# Patient Record
Sex: Female | Born: 1985 | Race: Black or African American | Hispanic: No | Marital: Single | Smoking: Never smoker
Health system: Southern US, Community
[De-identification: ages and names within clinical notes are randomized; demographics above are authoritative.]

## PROBLEM LIST (undated history)

## (undated) HISTORY — PX: NO PAST SURGERIES: SHX2092

---

## 2006-12-21 ENCOUNTER — Emergency Department: Payer: Self-pay | Admitting: Internal Medicine

## 2007-10-09 ENCOUNTER — Emergency Department (HOSPITAL_COMMUNITY): Admission: EM | Admit: 2007-10-09 | Discharge: 2007-10-10 | Payer: Self-pay | Admitting: Emergency Medicine

## 2007-10-10 ENCOUNTER — Emergency Department (HOSPITAL_COMMUNITY): Admission: EM | Admit: 2007-10-10 | Discharge: 2007-10-11 | Payer: Self-pay | Admitting: Emergency Medicine

## 2008-09-12 ENCOUNTER — Emergency Department: Payer: Self-pay

## 2008-12-11 ENCOUNTER — Emergency Department: Payer: Self-pay | Admitting: Emergency Medicine

## 2009-07-03 DIAGNOSIS — A6000 Herpesviral infection of urogenital system, unspecified: Secondary | ICD-10-CM | POA: Insufficient documentation

## 2009-10-25 ENCOUNTER — Emergency Department: Payer: Self-pay | Admitting: Emergency Medicine

## 2010-06-21 ENCOUNTER — Emergency Department: Payer: Self-pay | Admitting: Emergency Medicine

## 2010-09-27 ENCOUNTER — Observation Stay: Payer: Self-pay | Admitting: Obstetrics & Gynecology

## 2010-09-30 ENCOUNTER — Observation Stay: Payer: Self-pay | Admitting: Obstetrics and Gynecology

## 2010-11-12 ENCOUNTER — Observation Stay: Payer: Self-pay

## 2010-11-26 ENCOUNTER — Observation Stay: Payer: Self-pay

## 2010-11-29 ENCOUNTER — Observation Stay: Payer: Self-pay

## 2010-12-01 ENCOUNTER — Observation Stay: Payer: Self-pay | Admitting: Obstetrics and Gynecology

## 2010-12-02 ENCOUNTER — Inpatient Hospital Stay: Payer: Self-pay

## 2012-02-20 ENCOUNTER — Ambulatory Visit: Payer: Self-pay | Admitting: Family Medicine

## 2012-09-26 ENCOUNTER — Emergency Department: Payer: Self-pay | Admitting: Emergency Medicine

## 2012-09-26 LAB — URINALYSIS, COMPLETE
Bilirubin,UR: NEGATIVE
Ketone: NEGATIVE
Protein: 30
RBC,UR: 5 /HPF (ref 0–5)
Squamous Epithelial: 43
WBC UR: 14 /HPF (ref 0–5)

## 2012-10-20 ENCOUNTER — Observation Stay: Payer: Self-pay | Admitting: Obstetrics & Gynecology

## 2012-10-20 LAB — URINALYSIS, COMPLETE
Bilirubin,UR: NEGATIVE
Ketone: NEGATIVE
Leukocyte Esterase: NEGATIVE
Protein: NEGATIVE
Squamous Epithelial: 1

## 2012-12-28 ENCOUNTER — Observation Stay: Payer: Self-pay

## 2012-12-28 LAB — URINALYSIS, COMPLETE
Bilirubin,UR: NEGATIVE
Blood: NEGATIVE
Glucose,UR: NEGATIVE mg/dL (ref 0–75)
Ketone: NEGATIVE
Leukocyte Esterase: NEGATIVE
Squamous Epithelial: 2
WBC UR: 1 /HPF (ref 0–5)

## 2012-12-29 ENCOUNTER — Ambulatory Visit: Payer: Self-pay | Admitting: Obstetrics & Gynecology

## 2012-12-31 ENCOUNTER — Observation Stay: Payer: Self-pay | Admitting: Obstetrics and Gynecology

## 2012-12-31 LAB — URINALYSIS, COMPLETE
Bilirubin,UR: NEGATIVE
Blood: NEGATIVE
Glucose,UR: NEGATIVE mg/dL (ref 0–75)
Ph: 7 (ref 4.5–8.0)
Protein: NEGATIVE
RBC,UR: NONE SEEN /HPF (ref 0–5)
Specific Gravity: 1.01 (ref 1.003–1.030)

## 2013-01-25 ENCOUNTER — Observation Stay: Payer: Self-pay | Admitting: Obstetrics and Gynecology

## 2013-02-13 ENCOUNTER — Observation Stay: Payer: Self-pay

## 2013-02-15 ENCOUNTER — Observation Stay: Payer: Self-pay

## 2013-02-20 ENCOUNTER — Observation Stay: Payer: Self-pay | Admitting: Obstetrics & Gynecology

## 2013-02-21 ENCOUNTER — Observation Stay: Payer: Self-pay | Admitting: Obstetrics & Gynecology

## 2013-02-22 ENCOUNTER — Inpatient Hospital Stay: Payer: Self-pay

## 2013-02-22 LAB — CBC WITH DIFFERENTIAL/PLATELET
Basophil #: 0 10*3/uL (ref 0.0–0.1)
Basophil %: 0.3 %
Eosinophil %: 0.7 %
Lymphocyte %: 10.7 %
MCHC: 34 g/dL (ref 32.0–36.0)
MCV: 91 fL (ref 80–100)
Neutrophil #: 9.9 10*3/uL — ABNORMAL HIGH (ref 1.4–6.5)
Neutrophil %: 81.9 %
Platelet: 128 10*3/uL — ABNORMAL LOW (ref 150–440)
WBC: 12.1 10*3/uL — ABNORMAL HIGH (ref 3.6–11.0)

## 2013-02-23 LAB — HEMATOCRIT: HCT: 33 % — ABNORMAL LOW (ref 35.0–47.0)

## 2013-04-11 ENCOUNTER — Emergency Department: Payer: Self-pay | Admitting: Emergency Medicine

## 2013-12-02 ENCOUNTER — Emergency Department: Payer: Self-pay | Admitting: Emergency Medicine

## 2013-12-06 LAB — BETA STREP CULTURE(ARMC)

## 2014-09-16 ENCOUNTER — Emergency Department
Admit: 2014-09-16 | Disposition: A | Discharge status: Home / Self Care | Payer: Self-pay | Admitting: Emergency Medicine

## 2014-09-16 LAB — BASIC METABOLIC PANEL
ANION GAP: 5 — AB (ref 7–16)
BUN: 20 mg/dL
CALCIUM: 8.6 mg/dL — AB
CO2: 27 mmol/L
Chloride: 107 mmol/L
Creatinine: 0.75 mg/dL
GLUCOSE: 96 mg/dL
Potassium: 3.4 mmol/L — ABNORMAL LOW
SODIUM: 139 mmol/L

## 2014-09-16 LAB — CBC
HCT: 35.4 % (ref 35.0–47.0)
HGB: 11.8 g/dL — ABNORMAL LOW (ref 12.0–16.0)
MCH: 29.6 pg (ref 26.0–34.0)
MCHC: 33.3 g/dL (ref 32.0–36.0)
MCV: 89 fL (ref 80–100)
Platelet: 139 10*3/uL — ABNORMAL LOW (ref 150–440)
RBC: 3.98 10*6/uL (ref 3.80–5.20)
RDW: 12.2 % (ref 11.5–14.5)
WBC: 7.4 10*3/uL (ref 3.6–11.0)

## 2014-09-16 LAB — CK TOTAL AND CKMB (NOT AT ARMC)
CK, Total: 76 U/L
CK-MB: 0.7 ng/mL

## 2014-09-16 LAB — TROPONIN I: Troponin-I: <0.03 ng/mL

## 2014-10-18 NOTE — H&P (Signed)
L&D Evaluation:  History Expanded:  HPI 29 yo G2 P1, EDD of 02/21/13 per LMP and 12 week US, presents at 295w1d with c/o contractions since getting off work about 2 hours ago. Denies LOF or VB, +FM. [facility], +HSV culture.   Blood Type (Maternal) O positive   Group B Strep Results Maternal (Result >5wks must be treated as unknown) unknown/result > 5 weeks ago   Maternal HIV Negative   Maternal Syphilis Ab Nonreactive   Maternal Varicella Immune   Rubella Results (Maternal) immune   Maternal T-Dap Unknown   Patient's Medical History No Chronic Illness   Patient's Surgical History toe surgery   Medications Pre Natal Vitamins   Allergies PCN, hives   Social History none   ROS:  ROS see HPI   Exam:  Vital Signs stable   General no apparent distress   Mental Status clear   Chest clear   Heart no murmur/gallop/rubs   Abdomen gravid, tender with contractions   Estimated Fetal Weight Average for gestational age   Fetal Position vertex   Pelvic 1/75/-2   Mebranes Intact   FHT normal rate with no decels, baseline 145, mod variability, + accels   Ucx regular, q 1-4 min   Impression:  Impression IUP at 435w1d with PTL   Plan:  Plan UA   Comments Discussed with Dr Bonney AidStaebler, will give Terbutaline SQ and begin BMZ for fetal lung maturity. Recheck in about 2 hours, if making cervical change will begin Antibiotics for GBS prophylaxis. Consider Neonatalogy consult if delivery appears imminent.   Electronic Signatures: Vella KohlerBrothers, Dov Dill K (CNM)  (Signed 21-Jul-14 21:17)  Authored: L&D Evaluation   Last Updated: 21-Jul-14 21:17 by Vella KohlerBrothers, Younis Mathey K (CNM)

## 2014-10-18 NOTE — H&P (Signed)
L&D Evaluation:  History Expanded:  HPI 29 yo G2P1001 who was here on Monday night and got two shots of betamethasone returns tonight for contractions again. FFn is neg and ctx are palpable mild, her cervix id FT cm and she is comfortable. she did tis with her son also.   Gravida 2   Term 1   PreTerm 0   Abortion 0   Living 1   Blood Type (Maternal) O positive   Group B Strep Results Maternal (Result >5wks must be treated as unknown) unknown/result > 5 weeks ago   Maternal HIV Negative   Maternal Syphilis Ab Nonreactive   Maternal Varicella Immune   Rubella Results (Maternal) immune   M[facility] Immun[facility]   Presents with contractions   Patient's Medical History HSV 2 pos,   Patient's Surgical History none   Medications Pre Natal Vitamins   Allergies NKDA   Social History tobacco  before had pos UPT   Family History Non-Contributory   ROS:  ROS All systems were reviewed.  HEENT, CNS, GI, GU, Respiratory, CV, Renal and Musculoskeletal systems were found to be normal.   Exam:  Vital Signs stable   Urine Protein not completed   General no apparent distress   Mental Status clear   Chest clear   Heart normal sinus rhythm   Abdomen gravid, non-tender   Back no CVAT   Pelvic no external lesions   Mebranes Intact   FHT normal rate with no decels, CAT 1 strictly reacrive   Fetal Heart Rate 145   Ucx irregular   Ucx Frequency 34 min   Skin dry   Lymph no lymphadenopathy   Impression:  Impression preterm ctx   Plan:  Plan UA, EFM/NST, monitor contractions and for cervical change   Comments ffn and ua and mon itor v\cvxone cm. no change will gove terb and start on procardia.   Electronic Signatures: Adria DevonKlett, Harjit Leider (MD)  (Signed 25-Jul-14 00:59)  Authored: L&D Evaluation   Last Updated: 25-Jul-14 00:59 by Adria DevonKlett, Lamond Glantz (MD)

## 2014-10-18 NOTE — H&P (Signed)
L&D Evaluation:  History:  HPI 29 yo G2P1001, EDD of 02/21/13 per LMP and 12 week US, presents at 40.1 weeks with c/o contractions since yesterday. Seen in L&D last night and was discharged when cx remained 2 cm. [facility], received BMZ for threatened PTL at 32 weeks, HSV culture on labia this pregnancy -pt is taking Valtrex currently. Pt is O+, RI, VI, GBS negative.   Presents with contractions   Patient's Medical History HSV 2 pos   Patient's Surgical History removal of extra digit for polydactyly   Medications Pre Natal Vitamins  Valtrex dily. Was on FE-ran out recently   Allergies NKDA   Social History tobacco  quit with UPT   Family History Non-Contributory   ROS:  ROS see HPI   Exam:  Vital Signs stable   Urine Protein not completed   General breathing with contractions   Mental Status clear   Heart normal sinus rhythm, no murmur/gallop/rubs   Abdomen gravid, tender with contractions   Estimated Fetal Weight Average for gestational age, 86 1/2 to 7 #   Fetal Position cephalic   Reflexes 1+   Pelvic no external lesions, 4cm/75%/-1 (was 3cm on arrival)   Mebranes Intact   FHT normal rate with no decels, 125 baseline with accels to 150, mod variability   Ucx q4 min   Skin dry   Impression:  Impression IUP at 40 1/7 weeks in early/active labor   Plan:  Plan EFM/NST, monitor contractions and for cervical change, Stadol for pain. Epidural when desires. Will AROM when comfortable.   Electronic Signatures: Trinna BalloonGutierrez, Nayzeth Altman L (CNM)  (Signed 15-Sep-14 12:30)  Authored: L&D Evaluation   Last Updated: 15-Sep-14 12:30 by Trinna BalloonGutierrez, Acasia Skilton L (CNM)

## 2014-10-18 NOTE — H&P (Signed)
L&D Evaluation:  History Expanded:  HPI Patient at 22 weeks with pain in lower abd and back for few days. No vaginal bleeding.  +FM. No dysuria. Prenatal Care uncomplicated to this point.   Gravida 2   Term 1   Patient's Medical History No Chronic Illness   Patient's Surgical History none   Medications Pre Natal Vitamins   Allergies NKDA   Social History none   Family History Non-Contributory   ROS:  ROS All systems were reviewed.  HEENT, CNS, GI, GU, Respiratory, CV, Renal and Musculoskeletal systems were found to be normal.   Exam:  Vital Signs stable   General no apparent distress   Mental Status clear   Abdomen gravid, non-tender   Estimated Fetal Weight Average for gestational age   Back no CVAT   Edema no edema   Pelvic no external lesions, cervix closed and thick   FHT 130s   Ucx absent   Skin dry   Impression:  Impression Lower abd pain, no signs of Preterm Labor.   Plan:  Plan UA   Comments Monitor. Outpt follow-up. Tx UTI if pos UA. Hydrate.   Electronic Signatures: Letitia LibraHarris, Chloe Bluett Paul (MD)  (Signed 13-May-14 11:52)  Authored: L&D Evaluation   Last Updated: 13-May-14 11:52 by Letitia LibraHarris, Jeffery Bachmeier Paul (MD)

## 2014-10-18 NOTE — H&P (Signed)
L&D Evaluation:  History Expanded:  HPI 29 yo G2P1001, EDD of 02/21/13 per LMP and 12 week US, presents at 39 weeks with c/o contractions this evening. [facility], received BMZ for threatened PTL at 32 weeks, HSV culture on labia this pregnancy -pt is taking Valtrex currently.   Gravida 2   Term 1   PreTerm 0   Abortion 0   Living 1   Blood Type (Maternal) O positive   Group B Strep Results Maternal (Result >5wks must be treated as unknown) negative   Maternal HIV Negative   Maternal Syphilis Ab Nonreactive   Maternal Varicella Immune   Rubella Results (Maternal) immune   Maternal T-Dap Immune   [facility]   Presents with contractions   Patient's Medical History HSV 2 pos   Patient's Surgical History removal of extra digit for polydactyly   Medications Pre Natal Vitamins  Iron   Allergies NKDA   Social History tobacco  quit with UPT   Family History Non-Contributory   ROS:  ROS All systems were reviewed.  HEENT, CNS, GI, GU, Respiratory, CV, Renal and Musculoskeletal systems were found to be normal.   Exam:  Vital Signs stable   Urine Protein not completed   General no apparent distress   Mental Status clear   Chest clear   Heart normal sinus rhythm   Abdomen gravid, tender with contractions   Estimated Fetal Weight Average for gestational age   Fetal Position vertex   Back no CVAT   Pelvic no external lesions, 2/75/-2, unchanged in 1.5 hours   Mebranes Intact   FHT normal rate with no decels, Category 1 tracing   Ucx regular, q 2-6 minutes   Skin dry   Lymph no lymphadenopathy   Impression:  Impression early labor   Plan:  Comments Tylenolol and snack for headache Ambulate in hallways and recheck cervix in 2 hours   Electronic Signatures: Vella KohlerBrothers, Angles Trevizo K (CNM)  (Signed 07-Sep-14 00:33)  Authored: L&D Evaluation   Last Updated: 07-Sep-14 00:33 by Vella KohlerBrothers, Azzie Thiem K (CNM)

## 2014-10-18 NOTE — H&P (Signed)
L&D Evaluation:  History:  HPI 29 yo G2P1001, EDD of 02/21/13 per LMP and 12 week US, presents at 39.1 weeks with c/o contractions this evening. [facility], received BMZ for threatened PTL at 32 weeks, HSV culture on labia this pregnancy -pt is taking Valtrex currently. Pt is O+, RI, VI, GBS negative. Some vaginal spotting noticed today after being checked in the clinic and a small spot on her pad today in the hospital after a vaginal exam.   Presents with contractions   Patient's Medical History HSV 2 pos   Patient's Surgical History removal of extra digit for polydactyly   Medications Pre Natal Vitamins  Iron   Allergies NKDA   Social History tobacco  quit with UPT   Family History Non-Contributory   ROS:  ROS All systems were reviewed.  HEENT, CNS, GI, GU, Respiratory, CV, Renal and Musculoskeletal systems were found to be normal.   Exam:  Vital Signs stable   Urine Protein not completed   General no apparent distress   Mental Status clear   Chest clear   Heart normal sinus rhythm   Abdomen gravid, tender with contractions   Estimated Fetal Weight Average for gestational age   Fetal Position vertex   Back no CVAT   Pelvic no external lesions, 2-3/60/-3, unchanged in 1.5 hours   Mebranes Intact   FHT normal rate with no decels, Category 1 tracing   Ucx regular, q 2-6 minutes   Skin dry   Lymph no lymphadenopathy   Impression:  Impression early labor, reactive NST   Plan:  Plan discharge, will give 10mg  morphine IM for therapeutic rest. Labor precautions discussed.   Follow Up Appointment already scheduled   Electronic Signatures: Jannet MantisSubudhi, Delynn Olvera (CNM)  (Signed 08-Sep-14 22:44)  Authored: L&D Evaluation   Last Updated: 08-Sep-14 22:44 by Jannet MantisSubudhi, Huck Ashworth (CNM)

## 2015-07-04 ENCOUNTER — Encounter: Payer: Self-pay | Admitting: *Deleted

## 2015-07-04 ENCOUNTER — Emergency Department
Admission: EM | Admit: 2015-07-04 | Discharge: 2015-07-04 | Disposition: A | Discharge status: Home / Self Care | Payer: Medicaid Other | Attending: Emergency Medicine | Admitting: Emergency Medicine

## 2015-07-04 DIAGNOSIS — M25511 Pain in right shoulder: Secondary | ICD-10-CM | POA: Diagnosis not present

## 2015-07-04 DIAGNOSIS — F17200 Nicotine dependence, unspecified, uncomplicated: Secondary | ICD-10-CM | POA: Insufficient documentation

## 2015-07-04 DIAGNOSIS — M438X2 Other specified deforming dorsopathies, cervical region: Secondary | ICD-10-CM | POA: Diagnosis not present

## 2015-07-04 DIAGNOSIS — M436 Torticollis: Secondary | ICD-10-CM | POA: Insufficient documentation

## 2015-07-04 DIAGNOSIS — M438X4 Other specified deforming dorsopathies, thoracic region: Secondary | ICD-10-CM | POA: Insufficient documentation

## 2015-07-04 DIAGNOSIS — Z880 Allergy status to penicillin: Secondary | ICD-10-CM | POA: Diagnosis not present

## 2015-07-04 DIAGNOSIS — M542 Cervicalgia: Secondary | ICD-10-CM | POA: Diagnosis present

## 2015-07-04 MED ORDER — METHOCARBAMOL 500 MG PO TABS
1000.0000 mg | ORAL_TABLET | Freq: Once | ORAL | Status: AC
  Administered 2015-07-04: 1000 mg via ORAL
  Filled 2015-07-04: qty 2

## 2015-07-04 MED ORDER — IBUPROFEN 800 MG PO TABS
800.0000 mg | ORAL_TABLET | Freq: Once | ORAL | Status: AC
  Administered 2015-07-04: 800 mg via ORAL
  Filled 2015-07-04: qty 1

## 2015-07-04 MED ORDER — CYCLOBENZAPRINE HCL 10 MG PO TABS: 10.0000 mg | ORAL_TABLET | Freq: Three times a day (TID) | ORAL | Status: DC | PRN

## 2015-07-04 MED ORDER — TRAMADOL HCL 50 MG PO TABS: 50.0000 mg | ORAL_TABLET | Freq: Four times a day (QID) | ORAL | Status: DC | PRN

## 2015-07-04 MED ORDER — IBUPROFEN 800 MG PO TABS: 800.0000 mg | ORAL_TABLET | Freq: Three times a day (TID) | ORAL | Status: DC | PRN

## 2015-07-04 MED ORDER — TRAMADOL HCL 50 MG PO TABS
50.0000 mg | ORAL_TABLET | Freq: Once | ORAL | Status: AC
  Administered 2015-07-04: 50 mg via ORAL
  Filled 2015-07-04: qty 1

## 2015-07-04 NOTE — ED Notes (Signed)
States she developed right shoulder and neck pain today  Unsure of injury  But states she did not have pain when she woke up this am

## 2015-07-04 NOTE — Discharge Instructions (Signed)
Acute Torticollis °Torticollis is a condition in which the muscles of the neck tighten (contract) abnormally, causing the neck to twist and the head to move into an unnatural position. Torticollis that develops suddenly is called acute torticollis. If torticollis becomes chronic and is left untreated, the face and neck can become deformed. °CAUSES °This condition may be caused by: °· Sleeping in an awkward position (common). °· Extending or twisting the neck muscles beyond their normal position. °· Infection. °In some cases, the cause may not be known. °SYMPTOMS °Symptoms of this condition include: °· An unnatural position of the head. °· Neck pain. °· A limited ability to move the neck. °· Twisting of the neck to one side. °DIAGNOSIS °This condition is diagnosed with a physical exam. You may also have imaging tests, such as an X-ray, CT scan, or MRI. °TREATMENT °Treatment for this condition involves trying to relax the neck muscles. It may include: °· Medicines or shots. °· Physical therapy. °· Surgery. This may be done in severe cases. °HOME CARE INSTRUCTIONS °· Take medicines only as directed by your health care provider. °· Do stretching exercises and massage your neck as directed by your health care provider. °· Keep all follow-up visits as directed by your health care provider. This is important. °SEEK MEDICAL CARE IF: °· You develop a fever. °SEEK IMMEDIATE MEDICAL CARE IF: °· You develop difficulty breathing. °· You develop noisy breathing (stridor). °· You start drooling. °· You have trouble swallowing or have pain with swallowing. °· You develop numbness or weakness in your hands or feet. °· You have changes in your speech, understanding, or vision. °· Your pain gets worse. °  °This information is not intended to replace advice given to you by your health care provider. Make sure you discuss any questions you have with your health care provider. °  °Document Released: 05/24/2000 Document Revised:  10/11/2014 Document Reviewed: 05/23/2014 °Elsevier Interactive Patient Education ©2016 [employer] ° °

## 2015-07-04 NOTE — ED Notes (Signed)
Pt reports right side neck pain and right shoulder pain after sleeping on it last night.  Pt otc meds today for pain without relief.   Denies injury to neck/shoulder.

## 2015-07-04 NOTE — ED Provider Notes (Signed)
[facility] Emergency Department Provider Note  ____________________________________________  Time seen: Approximately 4:03 PM  I have reviewed the triage vital signs and the nursing notes.   HISTORY  Chief Complaint Torticollis    HPI Jaquanda Wickersham is a 30 y.o. female patient complaining of right neck in right upper shoulder pain that occurred after she woke up this morning. Patient states no relief with over-the-counter medications. Patient denies any other provocative incident for her complaint. Patient denies any loss of sensation or strength to the right upper extremity. Patient states neck pain increases with right lateral movements and abduction of the right upper extremity. Patient is rating her pain as a 9/10. Patient describes the pain as "sharp". No other palliative measures for her complaint.   No past medical history on file.  There are no active problems to display for this patient.   No past surgical history on file.  Current Outpatient Rx  Name  Route  Sig  Dispense  Refill  . cyclobenzaprine (FLEXERIL) 10 MG tablet   Oral   Take 1 tablet (10 mg total) by mouth every 8 (eight) hours as needed for muscle spasms.   15 tablet   0   . ibuprofen (ADVIL,MOTRIN) 800 MG tablet   Oral   Take 1 tablet (800 mg total) by mouth every 8 (eight) hours as needed for moderate pain.   15 tablet   0   . traMADol (ULTRAM) 50 MG tablet   Oral   Take 1 tablet (50 mg total) by mouth every 6 (six) hours as needed for moderate pain.   12 tablet   0     Allergies Penicillins  No family history on file.  Social History Social History  Substance Use Topics  . Smoking status: Current Every Day Smoker  . Smokeless tobacco: None  . Alcohol Use: No    Review of Systems Constitutional: No fever/chills Eyes: No visual changes. ENT: No sore throat. Cardiovascular: Denies chest pain. Respiratory: Denies shortness of breath. Gastrointestinal: No  abdominal pain.  No nausea, no vomiting.  No diarrhea.  No constipation. Genitourinary: Negative for dysuria. Musculoskeletal: Lateral neck and right shoulder pain. Skin: Negative for rash. Neurological: Negative for headaches, focal weakness or numbness. 10-point ROS otherwise negative.  ____________________________________________   PHYSICAL EXAM:  VITAL SIGNS: ED Triage Vitals  Enc Vitals Group     BP 07/04/15 1542 100/45 mmHg     Pulse Rate 07/04/15 1542 75     Resp 07/04/15 1542 20     Temp 07/04/15 1542 98.3 F (36.8 C)     Temp Source 07/04/15 1542 Oral     SpO2 07/04/15 1542 99 %     Weight 07/04/15 1542 120 lb (54.432 kg)     Height 07/04/15 1542  (1.626 m)     Head Cir --      Peak Flow --      Pain Score 07/04/15 1557 9     Pain Loc --      Pain Edu? --      Excl. in GC? --     Constitutional: Alert and oriented. Well appearing and in no acute distress. Eyes: Conjunctivae are normal. PERRL. EOMI. Head: Atraumatic. Nose: No congestion/rhinnorhea. Mouth/Throat: Mucous membranes are moist.  Oropharynx non-erythematous. Neck: No stridor.  No cervical spine tenderness to palpation. Hematological/Lymphatic/Immunilogical: No cervical lymphadenopathy. Cardiovascular: Normal rate, regular rhythm. Grossly normal heart sounds.  Good peripheral circulation. Respiratory: Normal respiratory effort.  No retractions. Lungs  CTAB. Gastrointestinal: Soft and nontender. No distention. No abdominal bruits. No CVA tenderness. Musculoskeletal: Deformity to the cervical and thoracic spine. Patient decreased range of motion right lateral movements. Patient also has increased pain with abduction of the right upper extremity. Neurologic:  Normal speech and language. No gross focal neurologic deficits are appreciated. No gait instability. Skin:  Skin is warm, dry and intact. No rash noted. Psychiatric: Mood and affect are normal. Speech and behavior are  normal.  ____________________________________________   LABS (all labs ordered are listed, but only abnormal results are displayed)  Labs Reviewed - No data to display ____________________________________________  EKG   ____________________________________________  RADIOLOGY   ____________________________________________   PROCEDURES  Procedure(s) performed: None  Critical Care performed: No  ____________________________________________   INITIAL IMPRESSION / ASSESSMENT AND PLAN / ED COURSE  Pertinent labs & imaging results that were available during my care of the patient were reviewed by me and considered in my medical decision making (see chart for details).  Torticollis. Patient given discharge care instructions. Patient given a prescription for tramadol, Flexeril, ibuprofen. Patient given a work note and advised [facility]. ____________________________________________   FINAL CLINICAL IMPRESSION(S) / ED DIAGNOSES  Final diagnoses:  Torticollis      Joni Reining, PA-C 07/04/15 1617  Phineas Semen, MD 07/04/15 [phone]

## 2015-07-12 ENCOUNTER — Encounter: Payer: Self-pay | Admitting: Emergency Medicine

## 2015-07-12 ENCOUNTER — Emergency Department
Admission: EM | Admit: 2015-07-12 | Discharge: 2015-07-12 | Disposition: A | Discharge status: Home / Self Care | Payer: Medicaid Other | Attending: Emergency Medicine | Admitting: Emergency Medicine

## 2015-07-12 DIAGNOSIS — J029 Acute pharyngitis, unspecified: Secondary | ICD-10-CM | POA: Diagnosis present

## 2015-07-12 DIAGNOSIS — F17200 Nicotine dependence, unspecified, uncomplicated: Secondary | ICD-10-CM | POA: Insufficient documentation

## 2015-07-12 DIAGNOSIS — J069 Acute upper respiratory infection, unspecified: Secondary | ICD-10-CM | POA: Insufficient documentation

## 2015-07-12 DIAGNOSIS — Z880 Allergy status to penicillin: Secondary | ICD-10-CM | POA: Insufficient documentation

## 2015-07-12 LAB — POCT RAPID STREP A: STREPTOCOCCUS, GROUP A SCREEN (DIRECT): NEGATIVE

## 2015-07-12 MED ORDER — PSEUDOEPH-BROMPHEN-DM 30-2-10 MG/5ML PO SYRP: 5.0000 mL | ORAL_SOLUTION | Freq: Four times a day (QID) | ORAL | Status: DC | PRN

## 2015-07-12 NOTE — ED Provider Notes (Signed)
[facility] Emergency Department Provider Note  ____________________________________________  Time seen: Approximately 12:44 PM  I have reviewed the triage vital signs and the nursing notes.   HISTORY  Chief Complaint Nasal Congestion; Generalized Body Aches; and Sore Throat    HPI Quintella Mura is a 30 y.o. female patient complaining of nasal congestion, sore throat, generalized body aches for 3 days. He stated is a greenishnasal discharge and a productive cough. Denies any fever but states she has chills. Patient denies any vomiting diarrhea but had intermittent nausea. No palliative measures taken for this complaint. Patient also has a son here who has a fever.   History reviewed. No pertinent past medical history.  There are no active problems to display for this patient.   History reviewed. No pertinent past surgical history.  Current Outpatient Rx  Name  Route  Sig  Dispense  Refill  . cyclobenzaprine (FLEXERIL) 10 MG tablet   Oral   Take 1 tablet (10 mg total) by mouth every 8 (eight) hours as needed for muscle spasms.   15 tablet   0   . ibuprofen (ADVIL,MOTRIN) 800 MG tablet   Oral   Take 1 tablet (800 mg total) by mouth every 8 (eight) hours as needed for moderate pain.   15 tablet   0   . traMADol (ULTRAM) 50 MG tablet   Oral   Take 1 tablet (50 mg total) by mouth every 6 (six) hours as needed for moderate pain.   12 tablet   0   . brompheniramine-pseudoephedrine-DM 30-2-10 MG/5ML syrup   Oral   Take 5 mLs by mouth 4 (four) times daily as needed.   120 mL   0     Allergies Penicillins  History reviewed. No pertinent family history.  Social History Social History  Substance Use Topics  . Smoking status: Current Some Day Smoker  . Smokeless tobacco: None  . Alcohol Use: No    Review of Systems Constitutional: No fever/chills Eyes: No visual changes. ENT: Sore throat. Rhinorrhea Cardiovascular: Denies chest  pain. Respiratory: Denies shortness of breath. Productive cough Gastrointestinal: No abdominal pain.  No nausea, no vomiting.  No diarrhea.  No constipation. Genitourinary: Negative for dysuria. Musculoskeletal: Negative for back pain. Skin: Negative for rash. Neurological: Negative for headaches, focal weakness or numbness. Allergic/Immunilogical: Penicillin  10-point ROS otherwise negative.  ____________________________________________   PHYSICAL EXAM:  VITAL SIGNS: ED Triage Vitals  Enc Vitals Group     BP --      Pulse --      Resp --      Temp --      Temp src --      SpO2 --      Weight --      Height --      Head Cir --      Peak Flow --      Pain Score --      Pain Loc --      Pain Edu? --      Excl. in GC? --     Constitutional: Alert and oriented. Well appearing and in no acute distress. Eyes: Conjunctivae are normal. PERRL. EOMI. Head: Atraumatic. Nose: Edematous nasal turbinates. Mouth/Throat: Mucous membranes are moist.  Oropharynx erythematous without exudative tonsils. Neck: No stridor. No cervical spine tenderness to palpation. Hematological/Lymphatic/Immunilogical: No cervical lymphadenopathy. Cardiovascular: Normal rate, regular rhythm. Grossly normal heart sounds.  Good peripheral circulation. Respiratory: Normal respiratory effort.  No retractions. Lungs CTAB. Gastrointestinal:  Soft and nontender. No distention. No abdominal bruits. No CVA tenderness. Musculoskeletal: No lower extremity tenderness nor edema.  No joint effusions. Neurologic:  Normal speech and language. No gross focal neurologic deficits are appreciated. No gait instability. Skin:  Skin is warm, dry and intact. No rash noted. Psychiatric: Mood and affect are normal. Speech and behavior are normal.  ____________________________________________   LABS (all labs ordered are listed, but only abnormal results are displayed)  Labs Reviewed  POCT RAPID STREP A    ____________________________________________  EKG   ____________________________________________  RADIOLOGY   ____________________________________________   PROCEDURES  Procedure(s) performed: None  Critical Care performed: No  ____________________________________________   INITIAL IMPRESSION / ASSESSMENT AND PLAN / ED COURSE  Pertinent labs & imaging results that were available during my care of the patient were reviewed by me and considered in my medical decision making (see chart for details).  Viral pharyngitis and upper respiratory infection. Discussed negative rapid strep test with mother. Patient given prescription for Bromfed-DM and advised Tylenol or ibuprofen body aches. He is advised to follow-up with family doctor if condition persists. FINAL CLINICAL IMPRESSION(S) / ED DIAGNOSES  Final diagnoses:  Viral pharyngitis  URI (upper respiratory infection)      Joni Reining, PA-C 07/12/15 1323  Emily Filbert, MD 07/12/15 1524

## 2015-07-12 NOTE — ED Notes (Signed)
Pt to ED from home c/o nasal congestion, sore throat, and generalized body aches x3-4 days.  Pt states green/yellow nasal drainage and productive cough and generalized body aches.   Pt denies fevers at home, denies SOB or pain.

## 2015-07-12 NOTE — Discharge Instructions (Signed)

## 2016-11-26 ENCOUNTER — Encounter (HOSPITAL_COMMUNITY): Payer: Self-pay | Admitting: Emergency Medicine

## 2016-11-26 ENCOUNTER — Emergency Department (HOSPITAL_COMMUNITY)
Admission: EM | Admit: 2016-11-26 | Discharge: 2016-11-26 | Disposition: A | Discharge status: Home / Self Care | Payer: Medicaid Other | Attending: Emergency Medicine | Admitting: Emergency Medicine

## 2016-11-26 DIAGNOSIS — F17200 Nicotine dependence, unspecified, uncomplicated: Secondary | ICD-10-CM | POA: Insufficient documentation

## 2016-11-26 DIAGNOSIS — F430 Acute stress reaction: Secondary | ICD-10-CM

## 2016-11-26 DIAGNOSIS — R45851 Suicidal ideations: Secondary | ICD-10-CM | POA: Diagnosis not present

## 2016-11-26 DIAGNOSIS — F419 Anxiety disorder, unspecified: Secondary | ICD-10-CM | POA: Diagnosis present

## 2016-11-26 LAB — RAPID URINE DRUG SCREEN, HOSP PERFORMED
Amphetamines: NOT DETECTED
Barbiturates: NOT DETECTED
Benzodiazepines: NOT DETECTED
COCAINE: NOT DETECTED
OPIATES: NOT DETECTED
TETRAHYDROCANNABINOL: NOT DETECTED

## 2016-11-26 LAB — COMPREHENSIVE METABOLIC PANEL
ALK PHOS: 56 U/L (ref 38–126)
ALT: 10 U/L — AB (ref 14–54)
AST: 17 U/L (ref 15–41)
Albumin: 4.4 g/dL (ref 3.5–5.0)
Anion gap: 9 (ref 5–15)
BILIRUBIN TOTAL: 0.3 mg/dL (ref 0.3–1.2)
BUN: 23 mg/dL — ABNORMAL HIGH (ref 6–20)
CALCIUM: 9 mg/dL (ref 8.9–10.3)
CO2: 22 mmol/L (ref 22–32)
Chloride: 108 mmol/L (ref 101–111)
Creatinine, Ser: 0.81 mg/dL (ref 0.44–1.00)
GFR calc non Af Amer: >60 mL/min (ref >60)
Glucose, Bld: 97 mg/dL (ref 65–99)
Potassium: 3.9 mmol/L (ref 3.5–5.1)
SODIUM: 139 mmol/L (ref 135–145)
TOTAL PROTEIN: 7.5 g/dL (ref 6.5–8.1)

## 2016-11-26 LAB — CBC
HCT: 36.8 % (ref 36.0–46.0)
HEMOGLOBIN: 12.5 g/dL (ref 12.0–15.0)
MCH: 29.4 pg (ref 26.0–34.0)
MCHC: 34 g/dL (ref 30.0–36.0)
MCV: 86.6 fL (ref 78.0–100.0)
Platelets: 191 10*3/uL (ref 150–400)
RBC: 4.25 MIL/uL (ref 3.87–5.11)
RDW: 12 % (ref 11.5–15.5)
WBC: 9.2 10*3/uL (ref 4.0–10.5)

## 2016-11-26 LAB — I-STAT BETA HCG BLOOD, ED (MC, WL, AP ONLY): I-stat hCG, quantitative: <5 m[IU]/mL (ref <5)

## 2016-11-26 LAB — ETHANOL

## 2016-11-26 LAB — ACETAMINOPHEN LEVEL

## 2016-11-26 LAB — SALICYLATE LEVEL

## 2016-11-26 NOTE — ED Provider Notes (Signed)
[facility]-EMERGENCY DEPT Provider Note   CSN: [id number] Arrival date & time: 11/26/16  0045   By signing my name below, I, Clarisse Gouge, attest that this documentation has been prepared under the direction and in the presence of Zadie Rhine, MD. Electronically signed, Clarisse Gouge, ED Scribe. 11/26/16. 4:01 AM.   History   Chief Complaint Chief Complaint  Patient presents with  . Suicidal   The history is provided by the patient and medical records. No language interpreter was used.  Anxiety  This is a new problem. The current episode started 3 to 5 hours ago. The problem has been resolved. Pertinent negatives include no chest pain, no abdominal pain, no headaches and no shortness of breath. The symptoms are relieved by lying down, rest and sleep.    Catherine Wolfe is a 31 y.o. female BIB [employer] to the Emergency Department concerning SI without plan reported PTA. [employer] allegedly called by the pt's parents because they state she expressed intent to kill herself. Pt states these feelings have resolved in [facility] ED without intervention after taking a nap while waiting for evaluation. She states she felt anxiety d/t homelessness PTA. Triage states pt told police she wanted to kill herself; she denies this. Illicit drug and alcohol use denied. No fever, vomit, headache, self harm or homicidal ideations noted. No other complaints at this time.  She has no plan at this time  PMH - none  OB History    No data available       Home Medications    Prior to Admission medications   Medication Sig Start Date End Date Taking? Authorizing Provider  brompheniramine-pseudoephedrine-DM 30-2-10 MG/5ML syrup Take 5 mLs by mouth 4 (four) times daily as needed. Patient not taking: Reported on 11/26/2016 07/12/15   Joni Reining, PA-C    Family History History reviewed. No pertinent family history.  Social History Social History  Substance Use Topics  . Smoking status: Current Some Day Smoker  .  Smokeless tobacco: Never Used  . Alcohol use No     Allergies   Penicillins   Review of Systems Review of Systems  Constitutional: Negative for fever.  Respiratory: Negative for shortness of breath.   Cardiovascular: Negative for chest pain.  Gastrointestinal: Negative for abdominal pain, nausea and vomiting.  Neurological: Negative for headaches.  Psychiatric/Behavioral: Negative for self-injury and suicidal ideas (pt denies suicidal ideations on evaluation). The patient is nervous/anxious.   All other systems reviewed and are negative.    Physical Exam Updated Vital Signs BP 114/69 (BP Location: Left Arm)   Pulse 83   Temp 98.1 F (36.7 C) (Oral)   Resp 20   Ht 5\' 4"  (1.626 m)   Wt 130 lb (59 kg)   SpO2 99%   BMI 22.31 kg/m   Physical Exam CONSTITUTIONAL: sleeping, easily arousable, no distress HEAD: Normocephalic/atraumatic EYES: EOMI ENMT: Mucous membranes moist NECK: supple no meningeal signs SPINE/BACK:entire spine nontender CV: S1/S2 noted, no murmurs/rubs/gallops noted LUNGS: Lungs are clear to auscultation bilaterally, no apparent distress ABDOMEN: soft, nontender, no rebound or guarding, bowel sounds noted throughout abdomen NEURO: Pt is awake/alert/appropriate, moves all extremitiesx4.  No facial droop.   EXTREMITIES: pulses normal/equal, full ROM SKIN: warm, color normal PSYCH: no abnormalities of mood noted, alert and oriented to situation   ED Treatments / Results  DIAGNOSTIC STUDIES: Oxygen Saturation is 99% on RA, NL by my interpretation.    COORDINATION OF CARE: 3:40 AM-Discussed next steps with pt. Pt verbalized understanding and  is agreeable with the plan. Pt prepared for d/c, advised of symptomatic care at home and return precautions.    Labs (all labs ordered are listed, but only abnormal results are displayed) Labs Reviewed  COMPREHENSIVE METABOLIC PANEL - Abnormal; Notable for the following:       Result Value   BUN 23 (*)    ALT 10  (*)    All other components within normal limits  ACETAMINOPHEN LEVEL - Abnormal; Notable for the following:    Acetaminophen (Tylenol), Serum <10 (*)    All other components within normal limits  ETHANOL  SALICYLATE LEVEL  CBC  RAPID URINE DRUG SCREEN, HOSP PERFORMED  I-STAT BETA HCG BLOOD, ED ([facility], [facility], [facility] ONLY)    EKG  EKG Interpretation None       Radiology No results found.  Procedures Procedures (including critical care time)  Medications Ordered in ED Medications - No data to display   Initial Impression / Assessment and Plan / ED Course  I have reviewed the triage vital signs and the nursing notes.  Pertinent labs  results that were available during my care of the patient were reviewed by me and considered in my medical decision making (see chart for details).     Pt with transient thoughts of suicide that have resolved She has no plan to harm herself Labs reassuring Pt has never attempted to harm herself and she denies h/o mental illness Patient gave permission for me to speak to her mother I spoke to mom Clarice by phone [phone]) She reports patient recently lost her housing and has been anxious She has children who are currently with father Patient sent mother a text message that reports she was "giving up" She has no h/o mental illness Pt was found sleeping in car and it was not running  She is now at baseline She denies SI Mother feels comfortable with patient leaving Patient has safe place to go She was given outpatient resources   Final Clinical Impressions(s) / ED Diagnoses   Final diagnoses:  Suicidal thoughts  Acute stress reaction    New Prescriptions New Prescriptions   No medications on file  I personally performed the services described in this documentation, which was scribed in my presence. The recorded information has been reviewed and is accurate.        Zadie RhineWickline, Jordi Kamm, MD 11/26/16 [phone]

## 2016-11-26 NOTE — ED Notes (Signed)
Patient states she became homeless and wants to hurt herself. Patient does not have a plan.

## 2016-11-26 NOTE — ED Triage Notes (Signed)
[employer] was called out due to patient sending text messages to parents that she was going to kill herself. Patient was found at [employer] parking lot and also told [employer] that she wants to kill herself.

## 2016-11-26 NOTE — Discharge Instructions (Signed)
Substance Abuse Treatment Programs ° °Intensive Outpatient Programs °[facility]     °[address]      °[place], [place]                   °[phone]      ° °[facility] °[address] °[place], [place] °[phone] ° °[facility]     °(Inpatient and outpatient)     °[address]           °[phone]   ° °[facility] °[phone] (Suboxone and Methadone) ° °[address]      °[place], [place] [address]      °336-882-2125      ° °[address] °[place], [place] °852-3033 ° °[facility] (Outpatient/Inpatient, [facility])    °(insurance only) [phone]      °       °[facility] (Groups & Residential) °[place], [place] °[phone] ° °   °[facility]     °[address]     °[place], [place]      °[phone]      ° °[facility] (for caregivers and family) °[address] °[place], [place] °336-299-4655 ° ° ° ° ° °Residential Treatment Programs °[facility]      °[address], [place], [place] [address]  °([phone]      ° °[facility] °[address] [place], [place] [address] °[phone] ° °[facility]        °336-248-8914      ° °[facility] °1-800-659-3381 ° °[facility]             °[address]                                         °[place], [place]                                                °877-615-2722 or 336-784-9470                              ° °[facility] [place] °[address] °[place], [address] °1.877.941.8954 ° °[facility]    °[address]      °[place], [place]     °[phone]      ° °[facility] °[address] °[place], [place] °336-285-9073 ° °[facility]   °[address]     °[place], [place] [address]     °[phone]      °Admissions: 8am-3pm M-F ° °[facility] °[address] °[place],  [place] °336-227-7417 ° °[facility]: Residential Program (90 Days)   °[place], [place]      °336-725-8389 or 800-758-6077    ° °[facility] °[place],  °(Walk in Hours over the weekend or by referral) ° °[facility] °[address], [place],  [address] °(336) [phone] ° °Crisis Mobile: [facility]:  1-877-626-1772 (for crisis response 24 hours a day) °[facility] Hotline:      1-800-256-2452 °Outpatient Psychiatry and Counseling ° °[facility]: Mobile Crisis   Management 24 hours:  1-877-626-1772 ° °[facility] sliding scale fee and walk in schedule: M-F 8am-12pm/1pm-3pm °[address]  °[place], [place] [address] °[phone] ° °[facility] °[address] °[place], [place] [address] °336-703-9650 ° °[facility] (Formerly known [facility])- new patient walk-in appointments available Monday - Friday 8am -3pm.          °[address] °[place], [place] [address] °336-676-6840 or crisis line- 336-676-6905 ° °[facility]/ Intensive Outpatient Therapy Program °[address] °[place], [place] [address] °336-832-9804 ° °[facility]                  °Crisis Services      °[phone]      °[address]     °[place], [place] [address]                ° °[facility]   °[facility] °[phone] °[address] °[place], [place] [address] ° ° °[facility]          °[address]  °[place], [place] [address]       °([phone] ° °[facility] °[address] °[place], [place] [address] °336-292-1510 ° °[facility]    °[address]    °[place], [place] [address]     °[phone]      ° °Catherine McKinney, Catherine Wolfe     °[address]     °[place] [address]     °336-282-1251     °  °[facility] °[address] °[place] [address] ° °[facility]     °[address]     °[place], [place]      °336-542-2076      ° °[facility] °Catherine Ahluwalia, Catherine Wolfe °[address] °[place], [place] [address] °336-420-9558 ° °[facility]     °[address]     °[place], [place] [address]     °336-274-1237      ° °[facility] °[address] °[place], [place] [address] °336-854-4450 °Resources for Temporary Residential Assistance/Crisis Centers ° °DAY CENTERS °[facility] °M-F 8am-3pm   °[address] [place], [place] [address]   336-332-0824 °Services include: laundry, barbering, support groups, case management, phone  & computer access, showers, AA/NA mtgs, mental health/substance abuse nurse, job skills class, disability information, VA assistance, spiritual classes, etc.  ° °HOMELESS SHELTERS ° °[facility]     °[facility]   °[address], [place]     °336.271.5959       °       °[facility] (women and children)       °[address] °[place], [place] [address] °336-275-0820 °[email or web address] for application and process °Application Required ° °[facility]   °[address]    °[place] [address]     °[phone]       °             °[facility] °[address] °, [place] [address] °336.273.5572 °336-235-0363(schedule application appt.) °Application Required ° °[facility] (women only)    °[address]     °[place], [place] [address]     °336-884-1039      °  Intake starts 6pm daily °Need valid ID, SSC, & Police report °[facility] [place] °[address] °[place], [place] °336-881-5420 °Application Required ° °[facility] (men only)     °[address]      °[place], [place]     °[phone]      ° °[facility] °(Pregnant women only) °[address] °[place], [place] °336-275-0206 ° °[facility]      °[address]      °[place], [place] [address]     °336-722-9951      °       °[facility] °[address] °[place], [place] °336-723-1848 °90 day commitment/SA/Application process ° °[facility](men only)     °[address]     °[place], [place]     °[phone]       °Check-in at 7pm     °       °[facility] °[address] °[place], [place] [address] °336-248-6684 °Men/Women/Women and Children must be there by 7 pm ° °[facility] °[place], [place] °336-722-8721                ° °

## 2017-08-08 DIAGNOSIS — R87629 Unspecified abnormal cytological findings in specimens from vagina: Secondary | ICD-10-CM

## 2017-08-08 HISTORY — DX: Unspecified abnormal cytological findings in specimens from vagina: R87.629

## 2017-09-01 LAB — HM PAP SMEAR: HM Pap smear: POSITIVE

## 2018-04-20 ENCOUNTER — Other Ambulatory Visit: Payer: Self-pay

## 2018-04-20 ENCOUNTER — Encounter: Payer: Self-pay | Admitting: Emergency Medicine

## 2018-04-20 ENCOUNTER — Emergency Department
Admission: EM | Admit: 2018-04-20 | Discharge: 2018-04-20 | Disposition: A | Discharge status: Home / Self Care | Payer: Worker's Compensation | Attending: Emergency Medicine | Admitting: Emergency Medicine

## 2018-04-20 ENCOUNTER — Emergency Department: Payer: Worker's Compensation

## 2018-04-20 DIAGNOSIS — S161XXA Strain of muscle, fascia and tendon at neck level, initial encounter: Secondary | ICD-10-CM | POA: Insufficient documentation

## 2018-04-20 DIAGNOSIS — S199XXA Unspecified injury of neck, initial encounter: Secondary | ICD-10-CM | POA: Diagnosis present

## 2018-04-20 DIAGNOSIS — Y92410 Unspecified street and highway as the place of occurrence of the external cause: Secondary | ICD-10-CM | POA: Diagnosis not present

## 2018-04-20 DIAGNOSIS — Z87891 Personal history of nicotine dependence: Secondary | ICD-10-CM | POA: Diagnosis not present

## 2018-04-20 DIAGNOSIS — Y9389 Activity, other specified: Secondary | ICD-10-CM | POA: Insufficient documentation

## 2018-04-20 DIAGNOSIS — Y990 Civilian activity done for income or pay: Secondary | ICD-10-CM | POA: Diagnosis not present

## 2018-04-20 MED ORDER — CYCLOBENZAPRINE HCL 10 MG PO TABS: 10.0000 mg | ORAL_TABLET | Freq: Three times a day (TID) | ORAL | 0 refills | Status: DC | PRN

## 2018-04-20 MED ORDER — IBUPROFEN 600 MG PO TABS: 600.0000 mg | ORAL_TABLET | Freq: Three times a day (TID) | ORAL | 0 refills | Status: DC | PRN
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
## 2018-04-28 ENCOUNTER — Other Ambulatory Visit: Payer: Self-pay

## 2018-04-28 ENCOUNTER — Emergency Department
Admission: EM | Admit: 2018-04-28 | Discharge: 2018-04-28 | Disposition: A | Discharge status: Home / Self Care | Payer: Medicaid Other | Attending: Emergency Medicine | Admitting: Emergency Medicine

## 2018-04-28 ENCOUNTER — Encounter: Payer: Self-pay | Admitting: Emergency Medicine

## 2018-04-28 DIAGNOSIS — Y929 Unspecified place or not applicable: Secondary | ICD-10-CM | POA: Diagnosis not present

## 2018-04-28 DIAGNOSIS — Y939 Activity, unspecified: Secondary | ICD-10-CM | POA: Diagnosis not present

## 2018-04-28 DIAGNOSIS — Y999 Unspecified external cause status: Secondary | ICD-10-CM | POA: Insufficient documentation

## 2018-04-28 DIAGNOSIS — S39012A Strain of muscle, fascia and tendon of lower back, initial encounter: Secondary | ICD-10-CM

## 2018-04-28 DIAGNOSIS — S3992XA Unspecified injury of lower back, initial encounter: Secondary | ICD-10-CM | POA: Diagnosis present

## 2018-04-28 DIAGNOSIS — Z87891 Personal history of nicotine dependence: Secondary | ICD-10-CM | POA: Insufficient documentation

## 2018-04-28 MED ORDER — PREDNISONE 10 MG PO TABS: 10.0000 mg | ORAL_TABLET | Freq: Every day | ORAL | 0 refills | Status: DC

## 2018-04-28 MED ORDER — KETOROLAC TROMETHAMINE 30 MG/ML IJ SOLN
30.0000 mg | Freq: Once | INTRAMUSCULAR | Status: AC
Start: 2018-04-28 — End: 2018-04-28
  Administered 2018-04-28: 30 mg via INTRAMUSCULAR
  Filled 2018-04-28: qty 1
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
## 2018-04-28 NOTE — Discharge Instructions (Addendum)
Please discontinue ibuprofen for 6 days and start steroid taper.  Continue with Flexeril as needed.  Please read and follow instructions for spinal conditioning program to work on stretching muscles of the cervical thoracic and lumbar spine.  If any increasing pain follow-up with primary care provider, return to the emergency department for any worsening symptoms or to changes in health.
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
## 2018-05-04 ENCOUNTER — Other Ambulatory Visit: Payer: Self-pay

## 2018-05-04 ENCOUNTER — Encounter: Payer: Self-pay | Admitting: *Deleted

## 2018-05-04 ENCOUNTER — Emergency Department: Payer: Medicaid Other

## 2018-05-04 ENCOUNTER — Emergency Department
Admission: EM | Admit: 2018-05-04 | Discharge: 2018-05-05 | Disposition: A | Discharge status: Home / Self Care | Payer: Medicaid Other | Attending: Emergency Medicine | Admitting: Emergency Medicine

## 2018-05-04 DIAGNOSIS — M545 Low back pain, unspecified: Secondary | ICD-10-CM

## 2018-05-04 DIAGNOSIS — Z87891 Personal history of nicotine dependence: Secondary | ICD-10-CM | POA: Diagnosis not present

## 2018-05-04 DIAGNOSIS — M542 Cervicalgia: Secondary | ICD-10-CM | POA: Diagnosis present
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
## 2018-05-05 MED ORDER — MELOXICAM 15 MG PO TABS: 15.0000 mg | ORAL_TABLET | Freq: Every day | ORAL | 0 refills | Status: DC

## 2018-05-05 MED ORDER — MELOXICAM 7.5 MG PO TABS
15.0000 mg | ORAL_TABLET | Freq: Once | ORAL | Status: AC
  Administered 2018-05-05: 15 mg via ORAL
  Filled 2018-05-05: qty 2

## 2018-05-05 MED ORDER — METHOCARBAMOL 500 MG PO TABS: 500.0000 mg | ORAL_TABLET | Freq: Four times a day (QID) | ORAL | 0 refills | Status: DC

## 2018-07-24 LAB — HM HIV SCREENING LAB: HM HIV Screening: NEGATIVE

## 2019-02-09 ENCOUNTER — Ambulatory Visit: Payer: Self-pay

## 2019-02-23 LAB — HM PAP SMEAR

## 2019-12-10 ENCOUNTER — Encounter: Payer: Self-pay | Admitting: Physician Assistant

## 2019-12-10 ENCOUNTER — Ambulatory Visit: Payer: Medicaid Other | Admitting: Physician Assistant

## 2019-12-10 ENCOUNTER — Other Ambulatory Visit: Payer: Self-pay

## 2019-12-10 DIAGNOSIS — N760 Acute vaginitis: Secondary | ICD-10-CM

## 2019-12-10 DIAGNOSIS — Z113 Encounter for screening for infections with a predominantly sexual mode of transmission: Secondary | ICD-10-CM | POA: Diagnosis not present

## 2019-12-10 LAB — WET PREP FOR TRICH, YEAST, CLUE
Trichomonas Exam: NEGATIVE
Yeast Exam: NEGATIVE

## 2019-12-10 MED ORDER — METRONIDAZOLE 500 MG PO TABS: 500.0000 mg | ORAL_TABLET | Freq: Two times a day (BID) | ORAL | 0 refills | Status: AC

## 2019-12-10 NOTE — Progress Notes (Signed)
[facility] Department STI clinic/screening visit  Subjective:  Catherine Wolfe is a 34 y.o. female being seen today for an STI screening visit. The patient reports they do have symptoms.  Patient reports that they do not desire a pregnancy in the next year.   They reported they are not interested in discussing contraception today.  Patient's last menstrual period was 11/17/2019 (exact date).   Patient has the following medical conditions:   Patient Active Problem List   Diagnosis Date Noted  . Herpes, genital 07/03/2009    Chief Complaint  Patient presents with  . SEXUALLY TRANSMITTED DISEASE    screening    HPI  Patient reports that she has had a yellow discharge and slight itching for a few days.  Reports last pap was 2020 and last HIV test also 2020.  Denies chronic conditions, surgeries and takes MVI and probiotics.  Has a Paragard IUD for BCM.   See flowsheet for further details and programmatic requirements.    The following portions of the patient's history were reviewed and updated as appropriate: allergies, current medications, past medical history, past social history, past surgical history and problem list.  Objective:  There were no vitals filed for this visit.  Physical Exam Constitutional:      General: She is not in acute distress.    Appearance: Normal appearance.  HENT:     Head: Normocephalic and atraumatic.     Comments: No nits, lice or hair loss. No cervical, supraclavicular or axillary adenopathy.    Mouth/Throat:     Mouth: Mucous membranes are moist.     Pharynx: Oropharynx is clear. No oropharyngeal exudate or posterior oropharyngeal erythema.  Eyes:     Conjunctiva/sclera: Conjunctivae normal.  Pulmonary:     Effort: Pulmonary effort is normal.  Abdominal:     Palpations: Abdomen is soft. There is no mass.     Tenderness: There is no abdominal tenderness. There is no guarding or rebound.  Genitourinary:    General: Normal vulva.      Rectum: Normal.     Comments: External genitalia/pubic area without nits, lice, edema, erythema, lesions and inguinal adenopathy. Vagina with normal mucosa and small amount of thin, whitish/yellow discharge, pH=>4.5. Cervix without visible lesions. Uterus firm, mobile, nt, no masses, no CMT, no adnexal tenderness or fullness. Musculoskeletal:     Cervical back: Neck supple. No tenderness.  Skin:    General: Skin is warm and dry.     Findings: No bruising, erythema, lesion or rash.  Neurological:     Mental Status: She is alert and oriented to person, place, and time.  Psychiatric:        Mood and Affect: Mood normal.        Behavior: Behavior normal.        Thought Content: Thought content normal.        Judgment: Judgment normal.      Assessment and Plan:  Catherine Wolfe is a 34 y.o. female presenting to the [facility] Department for STI screening  1. Screening for STD (sexually transmitted disease) Patient into clinic with symptoms. Rec condoms with all sex. Await test results.  Counseled that RN will call if needs to RTC for treatment once results are back. - WET PREP FOR TRICH, YEAST, CLUE - Gonococcus culture - Chlamydia/Gonorrhea [place] [facility] - HIV [place] [facility] - Syphilis Serology, [place] [facility] - Gonococcus culture  2. BV (bacterial vaginosis) Will treat for BV with Metronidazole 500mg  #  14 1 po BID for 7 days with food, no EtOH for 24 hr before and until 72 hr after completing medicine. No sex for 7 days. Enc to use OTC antifungal cream if has itching during or just after treatment with antibiotic. - metroNIDAZOLE (FLAGYL) 500 MG tablet; Take 1 tablet (500 mg total) by mouth 2 (two) times daily for 7 days.  Dispense: 14 tablet; Refill: 0     No follow-ups on file.  No future appointments.  Matt Holmes, PA

## 2019-12-13 NOTE — Progress Notes (Signed)
Chart reviewed by Pharmacist  Suzanne Walker PharmD, Contract Pharmacist at [facility]  

## 2019-12-16 LAB — GONOCOCCUS CULTURE

## 2020-04-13 ENCOUNTER — Ambulatory Visit: Payer: Medicaid Other

## 2020-09-04 LAB — HM PAP SMEAR: HM Pap smear: POSITIVE

## 2020-10-11 ENCOUNTER — Other Ambulatory Visit: Payer: Self-pay | Admitting: Physician Assistant

## 2020-10-11 DIAGNOSIS — N6320 Unspecified lump in the left breast, unspecified quadrant: Secondary | ICD-10-CM

## 2020-10-26 ENCOUNTER — Other Ambulatory Visit: Payer: Self-pay | Admitting: *Deleted

## 2020-10-26 ENCOUNTER — Inpatient Hospital Stay
Admission: RE | Admit: 2020-10-26 | Discharge: 2020-10-26 | Disposition: A | Discharge status: Home / Self Care | Payer: Self-pay | Source: Ambulatory Visit | Attending: *Deleted | Admitting: *Deleted

## 2020-10-26 DIAGNOSIS — Z1231 Encounter for screening mammogram for malignant neoplasm of breast: Secondary | ICD-10-CM

## 2020-11-08 ENCOUNTER — Other Ambulatory Visit: Payer: Self-pay | Admitting: Physician Assistant

## 2020-11-08 DIAGNOSIS — N6320 Unspecified lump in the left breast, unspecified quadrant: Secondary | ICD-10-CM

## 2020-11-13 ENCOUNTER — Other Ambulatory Visit: Payer: Self-pay

## 2020-11-13 ENCOUNTER — Ambulatory Visit
Admission: RE | Admit: 2020-11-13 | Discharge: 2020-11-13 | Disposition: A | Discharge status: Home / Self Care | Payer: Medicaid Other | Source: Ambulatory Visit | Attending: Physician Assistant | Admitting: Physician Assistant

## 2020-11-13 DIAGNOSIS — N6320 Unspecified lump in the left breast, unspecified quadrant: Secondary | ICD-10-CM | POA: Diagnosis present

## 2020-11-13 DIAGNOSIS — N6321 Unspecified lump in the left breast, upper outer quadrant: Secondary | ICD-10-CM | POA: Diagnosis not present

## 2021-06-09 IMAGING — US US BREAST*L* LIMITED INC AXILLA
1 series · 6 of 6 positions shown · non-contrast
Comparison: Ultrasound images from 5905 and 8846 are available for
comparison.

CLINICAL DATA: 34-year-old female presenting for evaluation of a
palpable lump in the left breast. She has previously evaluated for a
palpable area in the left breast in 5905 and in 8846. A benign lymph
node was seen in the left breast in 8846, and no abnormalities were
seen in 5905.

EXAM:
DIGITAL DIAGNOSTIC BILATERAL MAMMOGRAM WITH TOMOSYNTHESIS AND CAD;
ULTRASOUND LEFT BREAST LIMITED
TECHNIQUE: Bilateral digital diagnostic mammography and breast tomosynthesis
was performed. The images were evaluated with computer-aided
detection.; Targeted ultrasound examination of the left breast was
performed

[Series 1: us breast*left* limited inc axilla · 0.05mm/px · 6 of 6 slices shown]
[im 1/6]
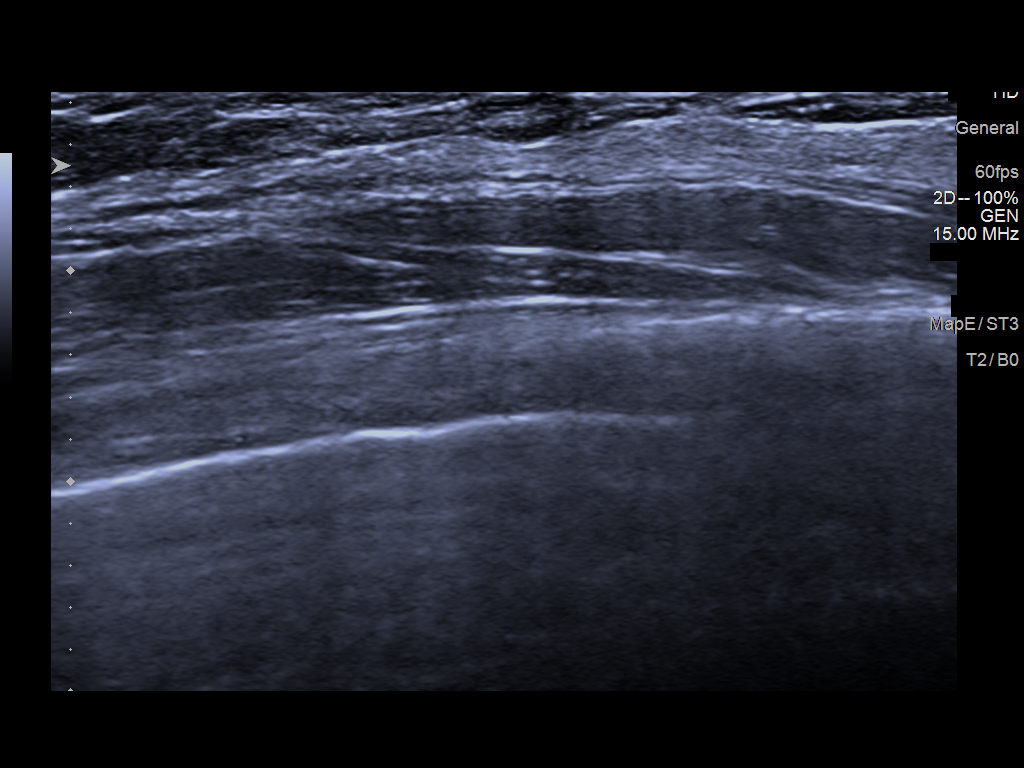
[im 2/6]
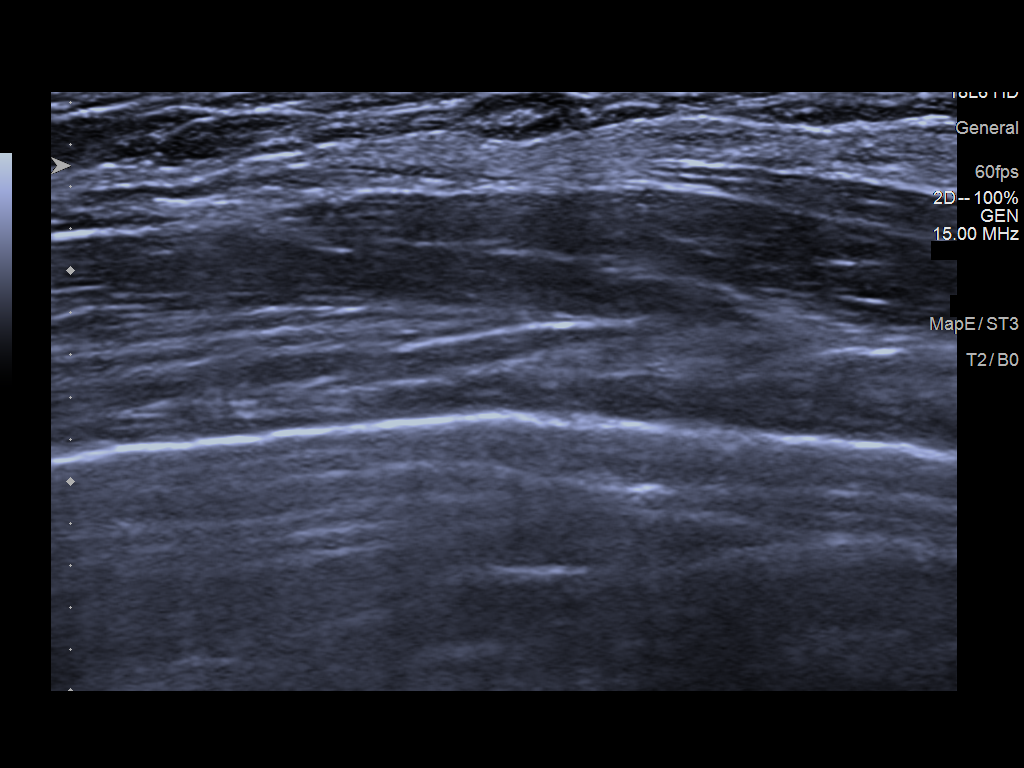
[im 3/6]
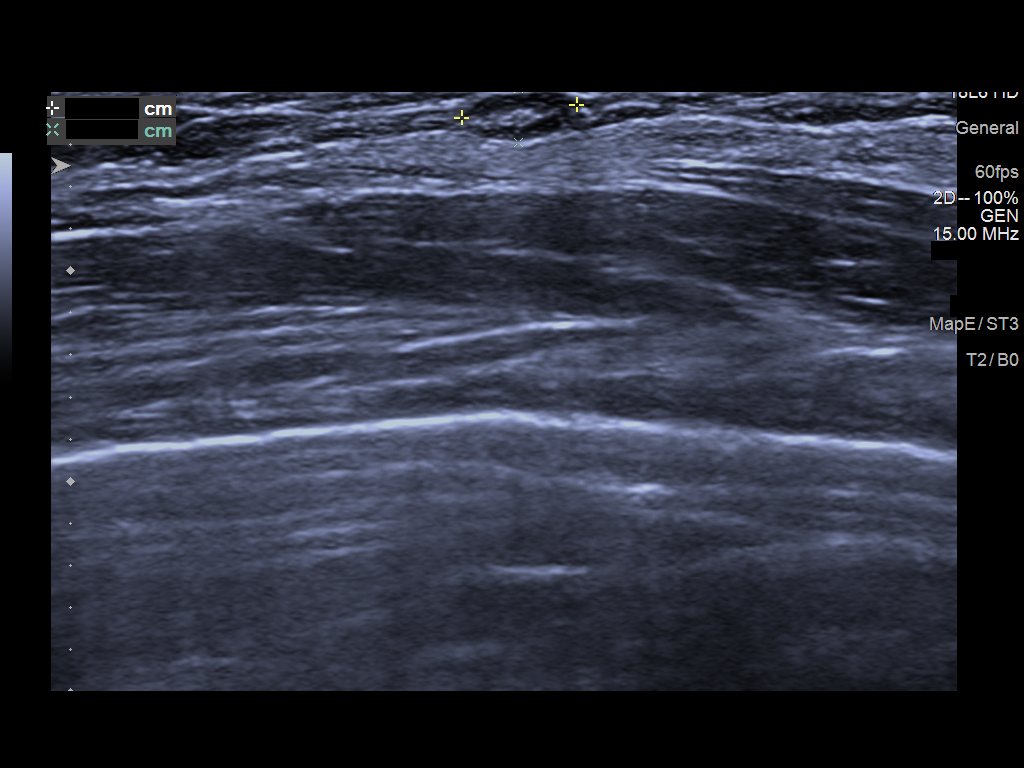
[im 4/6]
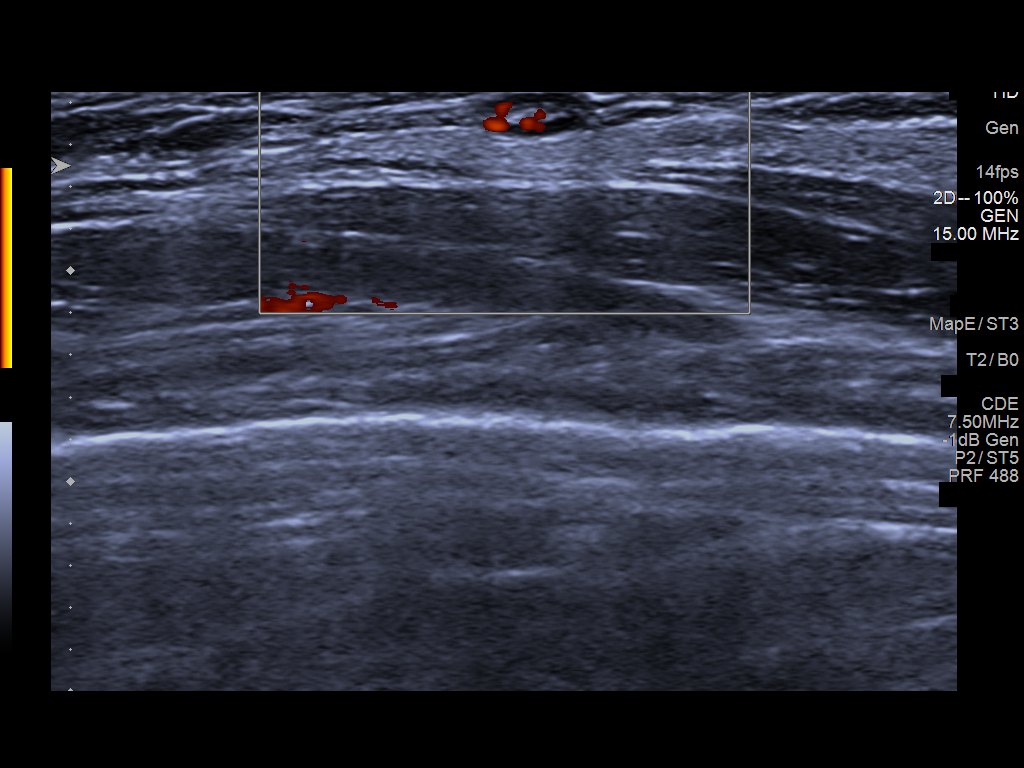
[im 5/6]
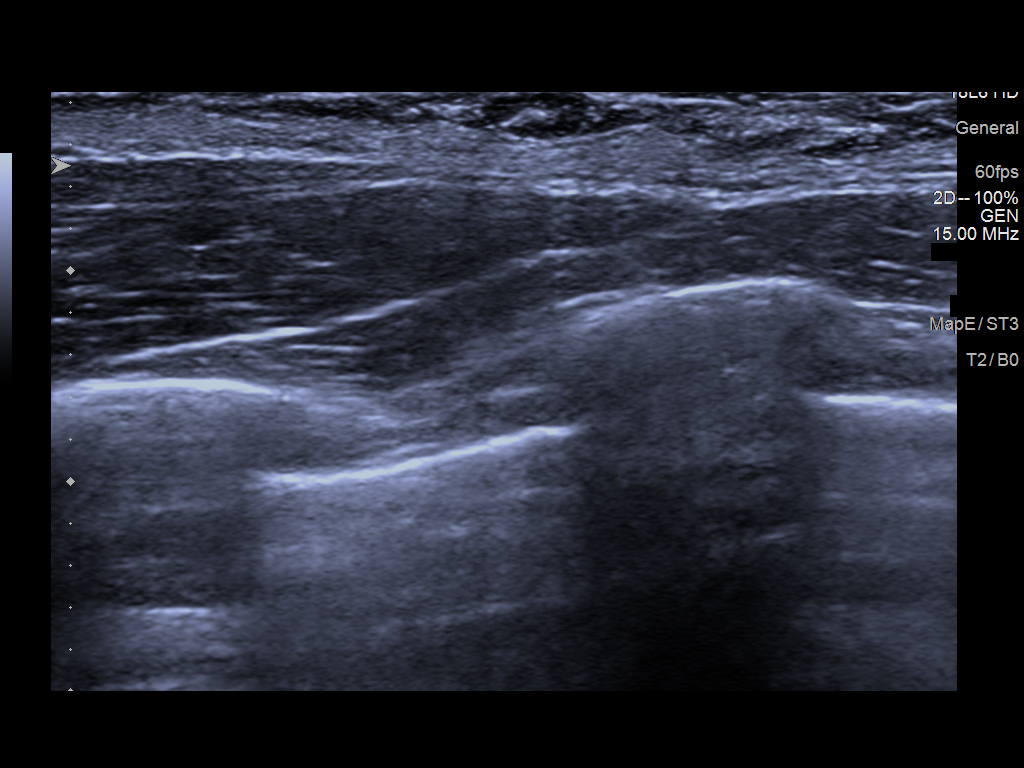
[im 6/6]
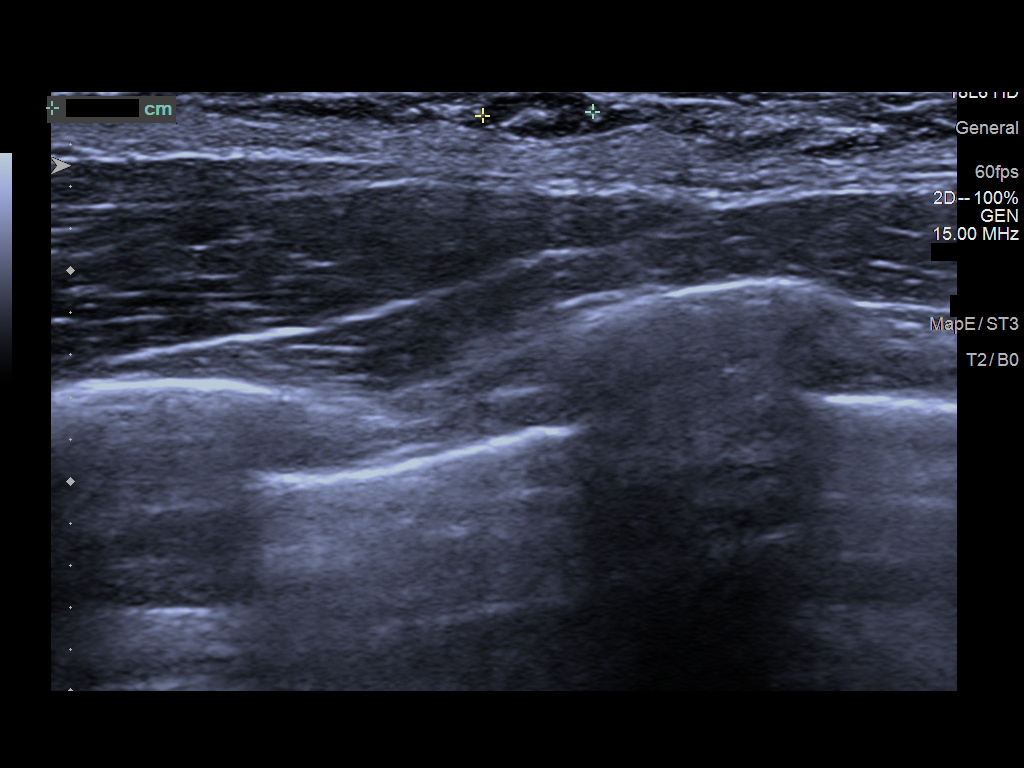

[6 of 6 positions shown; findings below may reference images not displayed]

No prior mammogram images.

ACR Breast Density Category c: The breast tissue is heterogeneously
dense, which may obscure small masses.
FINDINGS: No suspicious calcifications, masses or areas of distortion are seen
in the bilateral breasts.

Ultrasound targeted to the palpable site identified by the patient
at [DATE], 10 cm from the nipple demonstrates a superficial benign
appearing lymph node measuring 6 x 3 x 5 mm. This likely corresponds
with the lymph node visualized on the 02/20/2012 mammogram which was
labeled as 2 o'clock, 12 cm from the nipple. No suspicious masses or
areas of shadowing are identified.
IMPRESSION: 1. The palpable site in the upper-outer left breast corresponds with
a stable benign lymph node.

2.  No mammographic evidence of malignancy in the bilateral breasts.

RECOMMENDATION:
Screening mammogram at age 40 unless there are persistent or
intervening clinical concerns. (Code:[id number]-G-I2P)

I have discussed the findings and recommendations with the patient.
If applicable, a reminder letter will be sent to the patient
regarding the next appointment.

BI-RADS CATEGORY  1: Negative.

## 2021-08-17 ENCOUNTER — Other Ambulatory Visit: Payer: Self-pay

## 2021-08-17 ENCOUNTER — Ambulatory Visit: Payer: Medicaid Other | Admitting: Family Medicine

## 2021-08-17 ENCOUNTER — Encounter: Payer: Self-pay | Admitting: Family Medicine

## 2021-08-17 DIAGNOSIS — Z113 Encounter for screening for infections with a predominantly sexual mode of transmission: Secondary | ICD-10-CM | POA: Diagnosis not present

## 2021-08-17 DIAGNOSIS — B3731 Acute candidiasis of vulva and vagina: Secondary | ICD-10-CM

## 2021-08-17 LAB — WET PREP FOR TRICH, YEAST, CLUE
Trichomonas Exam: NEGATIVE
Yeast Exam: NEGATIVE

## 2021-08-17 LAB — HM HIV SCREENING LAB: HM HIV Screening: NEGATIVE

## 2021-08-17 MED ORDER — CLOTRIMAZOLE 1 % VA CREA: 1.0000 | TOPICAL_CREAM | Freq: Every day | VAGINAL | 0 refills | Status: AC

## 2021-08-17 NOTE — Progress Notes (Signed)
Patient seen today for STD testing. Wet prep reviewed, providers orders completed. Condoms declined.  ?

## 2021-08-17 NOTE — Progress Notes (Signed)
[facility] Department ? ?STI clinic/screening visit ?[address] ?[place] [address] ?[phone] ? ?Subjective:  ?Catherine Wolfe is a 36 y.o. female being seen today for an STI screening visit. The patient reports they do have symptoms.  Patient reports that they do not desire a pregnancy in the next year.   They reported they are not interested in discussing contraception today.   ? ?Patient's last menstrual period was 07/24/2021 (exact date). ? ? ?Patient has the following medical conditions:   ?Patient Active Problem List  ? Diagnosis Date Noted  ? Herpes, genital 07/03/2009  ? ? ?Chief Complaint  ?Patient presents with  ? SEXUALLY TRANSMITTED DISEASE  ?  screening  ? ? ?HPI ? ?Patient reports here for screening, reports s/sx  ? ?Last HIV test per patient/review of record was 12/2019 ?Patient reports last pap was 2022 at [facility]  ? ?Screening for MPX risk: ?Does the patient have an unexplained rash? No ?Is the patient MSM? No ?Does the patient endorse multiple sex partners or anonymous sex partners? No ?Did the patient have close or sexual contact with a person diagnosed with MPX? No ?Has the patient traveled outside the [place] where MPX is endemic? No ?Is there a high clinical suspicion for MPX-- evidenced by one of the following No ? -Unlikely to be chickenpox ? -Lymphadenopathy ? -Rash that present in same phase of evolution on any given body part ?See flowsheet for further details and programmatic requirements.  ? ? ?The following portions of the patient's history were reviewed and updated as appropriate: allergies, current medications, past medical history, past social history, past surgical history and problem list. ? ?Objective:  ?There were no vitals filed for this visit. ? ?Physical Exam ?Vitals and nursing note reviewed.  ?Constitutional:   ?   Appearance: Normal appearance.  ?HENT:  ?   Head: Normocephalic and atraumatic.  ?   Mouth/Throat:  ?   Mouth: Mucous membranes are moist.  ?    Pharynx: Oropharynx is clear. No oropharyngeal exudate or posterior oropharyngeal erythema.  ?Pulmonary:  ?   Effort: Pulmonary effort is normal.  ?Abdominal:  ?   General: Abdomen is flat.  ?   Palpations: There is no mass.  ?   Tenderness: There is no abdominal tenderness. There is no rebound.  ?Genitourinary: ?   General: Normal vulva.  ?   Exam position: Lithotomy position.  ?   Pubic Area: No rash or pubic lice.   ?   Labia:     ?   Right: No rash or lesion.     ?   Left: No rash or lesion.   ?   Vagina: Normal. No vaginal discharge, erythema, bleeding or lesions.  ?   Cervix: No cervical motion tenderness, discharge, friability, lesion or erythema.  ?   Uterus: Normal.   ?   Adnexa: Right adnexa normal and left adnexa normal.  ?   Rectum: Normal.  ?   Comments: External genitalia without, lice, nits, erythema, edema , lesions or inguinal adenopathy. Vagina with normal mucosa and  off white discharge and pH equals 4.  Cervix without visual lesions, uterus firm, mobile, non-tender, no masses, CMT adnexal fullness or tenderness.  ? ?Lymphadenopathy:  ?   Head:  ?   Right side of head: No preauricular or posterior auricular adenopathy.  ?   Left side of head: No preauricular or posterior auricular adenopathy.  ?   Cervical: No cervical adenopathy.  ?  Upper Body:  ?   Right upper body: No supraclavicular or axillary adenopathy.  ?   Left upper body: No supraclavicular or axillary adenopathy.  ?   Lower Body: No right inguinal adenopathy. No left inguinal adenopathy.  ?Skin: ?   General: Skin is warm and dry.  ?   Findings: No rash.  ?Neurological:  ?   Mental Status: She is alert and oriented to person, place, and time.  ?Psychiatric:     ?   Mood and Affect: Mood normal.     ?   Behavior: Behavior normal.  ? ? ? ?Assessment and Plan:  ?Catherine Wolfe is a 36 y.o. female presenting to the [facility] Department for STI screening ? ?1. Screening for venereal disease ? ?Patient accepted all screenings  including wet prep,  vaginal CT/GC and bloodwork for HIV/RPR.  ?Patient meets criteria for HepB screening? No. Ordered? No -   ?Patient meets criteria for HepC screening? No. Ordered? No -   ? ?Wet prep results neg    ?Discussed time line for State Lab results and that patient will be called with positive results and encouraged patient to call if she had not heard in 2 weeks.  ?Counseled to return or seek care for continued or worsening symptoms ?Recommended condom use with all sex ? ?Patient is currently using  ParaGuard  to prevent pregnancy.  ?- Chlamydia/Gonorrhea [place] [facility] ?- HIV [place] [facility] ?- WET PREP FOR TRICH, YEAST, CLUE ?- Syphilis Serology, [place] [facility] ? ?2. Yeast vaginitis ?Yeast seen on assessment  ?- clotrimazole (V-R CLOTRIMAZOLE VAGINAL) 1 % vaginal cream; Place 1 Applicatorful vaginally at bedtime for 7 days.  Dispense: 45 g; Refill: 0 ? ? ? ?Return for as needed. ? ?No future appointments. ? ?Wendi Snipes, FNP ?

## 2021-08-27 NOTE — Progress Notes (Signed)
Order(s) created erroneously. Erroneous order ID: [id number] ? Order moved by: Ruben Reason ? Order move date/time: 08/27/2021 1:50 PM ? Source Patient: [id number] ? Source Contact: 08/27/2021 ? Destination Patient: [id number] ? Destination Contact: 08/25/2012 ?

## 2021-12-25 ENCOUNTER — Encounter: Payer: Self-pay | Admitting: Advanced Practice Midwife

## 2021-12-25 ENCOUNTER — Ambulatory Visit (LOCAL_COMMUNITY_HEALTH_CENTER): Payer: Medicaid Other | Admitting: Advanced Practice Midwife

## 2021-12-25 VITALS — BP 96/57 | HR 96 | Temp 97.5°F | Resp 16 | Ht 64.0 in | Wt 130.0 lb

## 2021-12-25 DIAGNOSIS — Z308 Encounter for other contraceptive management: Secondary | ICD-10-CM

## 2021-12-25 DIAGNOSIS — Z5321 Procedure and treatment not carried out due to patient leaving prior to being seen by health care provider: Secondary | ICD-10-CM

## 2021-12-25 DIAGNOSIS — Z3009 Encounter for other general counseling and advice on contraception: Secondary | ICD-10-CM

## 2021-12-25 LAB — HM HIV SCREENING LAB: HM HIV Screening: NEGATIVE

## 2021-12-25 NOTE — Progress Notes (Signed)
Patient is here for annual physical and PAP. Patient could not stay for entirety of appt due to need to pick up son from summer [employer] at [place]. Nursing portion completed. See below and flow-sheet. Patient to schedule PE and PAP visit for provider portion. Provider Hazle Coca, CNM made aware. Per provider patient could do lab work today for HIV and Syphilis. RN helped set up MyChart for patient so she can see test results.   Last PE: Patient states she goes to [facility], unsure when last PE was. [facility] has yet to fax previous PAP results. Per patient last PAP was in 2022 however she is unsure of results. Patient had abnormal P[facility]2017 [facility] with [facility] on 09/2017. Patient did not f/u after colpo.   BCM: Patient states she has paragard and happy with this BCM.   STD: previous testing 08/17/21 = gc/chlamydia = negative.   Earlyne Iba, RN

## 2022-02-04 ENCOUNTER — Ambulatory Visit: Payer: Medicaid Other

## 2022-02-18 ENCOUNTER — Ambulatory Visit (LOCAL_COMMUNITY_HEALTH_CENTER): Payer: Medicaid Other | Admitting: Nurse Practitioner

## 2022-02-18 ENCOUNTER — Encounter: Payer: Self-pay | Admitting: Nurse Practitioner

## 2022-02-18 VITALS — BP 99/66 | Ht 64.0 in | Wt 130.6 lb

## 2022-02-18 DIAGNOSIS — Z113 Encounter for screening for infections with a predominantly sexual mode of transmission: Secondary | ICD-10-CM

## 2022-02-18 DIAGNOSIS — Z309 Encounter for contraceptive management, unspecified: Secondary | ICD-10-CM | POA: Diagnosis not present

## 2022-02-18 DIAGNOSIS — Z01419 Encounter for gynecological examination (general) (routine) without abnormal findings: Secondary | ICD-10-CM

## 2022-02-18 DIAGNOSIS — Z3009 Encounter for other general counseling and advice on contraception: Secondary | ICD-10-CM

## 2022-02-18 LAB — WET PREP FOR TRICH, YEAST, CLUE
Trichomonas Exam: NEGATIVE
Yeast Exam: NEGATIVE

## 2022-02-18 NOTE — Progress Notes (Signed)
[facility] DEPARTMENT [facility] [address] Main Number: [phone]    Family Planning Visit- Initial Visit  Subjective:  Catherine Wolfe is a 36 y.o.  G2P2002   being seen today for an initial annual visit and to discuss reproductive life planning.  The patient is currently using IUD or IUS for pregnancy prevention. Patient reports   does not want a pregnancy in the next year.     report they are looking for a method that provides High efficacy at preventing pregnancy  Patient has the following medical conditions has Herpes, genital on their problem list.  Chief Complaint  Patient presents with   Annual Exam    PE and Pap smear    Patient reports to clinic today for a physical and STD screening.    Body mass index is 22.42 kg/m. - Patient is eligible for diabetes screening based on BMI and age >79?  not applicable HA1C ordered? not applicable  Patient reports 1  partner/s in last year. Desires STI screening?  Yes  Has patient been screened once for HCV in the past?  No  No results found for: "HCVAB"  Does the patient have current drug use (including MJ), have a partner with drug use, and/or has been incarcerated since last result? No  If yes-- Screen for HCV through [facility]   Does the patient meet criteria for HBV testing? No  Criteria:  -Household, sexual or needle sharing contact with HBV -History of drug use -HIV positive -Those with known Hep C   Health Maintenance Due  Topic Date Due   COVID-19 Vaccine (1) Never done   Hepatitis C Screening  Never done   TETANUS/TDAP  Never done   PAP SMEAR-Modifier  09/02/2018   INFLUENZA VACCINE  Never done    Review of Systems  Constitutional:  Negative for chills, fever, malaise/fatigue and weight loss.  HENT:  Negative for congestion, hearing loss and sore throat.   Eyes:  Negative for blurred vision, double vision and photophobia.  Respiratory:  Negative for shortness  of breath.   Cardiovascular:  Negative for chest pain.  Gastrointestinal:  Negative for abdominal pain, blood in stool, constipation, diarrhea, heartburn, nausea and vomiting.  Genitourinary:  Negative for dysuria and frequency.  Musculoskeletal:  Negative for back pain, joint pain and neck pain.  Skin:  Negative for itching and rash.  Neurological:  Positive for dizziness and headaches. Negative for weakness.  Endo/Heme/Allergies:  Does not bruise/bleed easily.  Psychiatric/Behavioral:  Negative for depression, substance abuse and suicidal ideas.     The following portions of the patient's history were reviewed and updated as appropriate: allergies, current medications, past family history, past medical history, past social history, past surgical history and problem list. Problem list updated.   See flowsheet for other program required questions.  Objective:   Vitals:   02/18/22 1018  BP: 99/66  Weight: 130 lb 9.6 oz (59.2 kg)  Height: 5\' 4"  (1.626 m)    Physical Exam Constitutional:      Appearance: Normal appearance.  HENT:     Head: Normocephalic. No abrasion, masses or laceration. Hair is normal.     Jaw: No tenderness or swelling.     Right Ear: External ear normal.     Left Ear: External ear normal.     Nose: Nose normal.     Mouth/Throat:     Lips: Pink. No lesions.     Mouth: Mucous membranes are moist.  No lacerations or oral lesions.     Dentition: No dental caries.     Tongue: No lesions.     Palate: No mass and lesions.     Pharynx: No pharyngeal swelling, oropharyngeal exudate, posterior oropharyngeal erythema or uvula swelling.     Tonsils: No tonsillar exudate or tonsillar abscesses.     Comments: No visible signs of dental caries  Eyes:     Pupils: Pupils are equal, round, and reactive to light.  Neck:     Thyroid: No thyroid mass, thyromegaly or thyroid tenderness.  Cardiovascular:     Rate and Rhythm: Normal rate and regular rhythm.  Pulmonary:      Effort: Pulmonary effort is normal.     Breath sounds: Normal breath sounds.  Chest:  Breasts:    Right: Normal. No swelling, mass, nipple discharge, skin change or tenderness.     Left: Normal. No swelling, mass, nipple discharge, skin change or tenderness.  Abdominal:     General: Abdomen is flat. Bowel sounds are normal.     Palpations: Abdomen is soft.     Tenderness: There is no abdominal tenderness. There is no rebound.  Genitourinary:    Pubic Area: No rash or pubic lice.      Labia:        Right: No rash, tenderness or lesion.        Left: No rash, tenderness or lesion.      Vagina: Normal. No vaginal discharge, erythema, tenderness or lesions.     Cervix: No cervical motion tenderness, discharge, lesion or erythema.     Uterus: Normal.      Adnexa:        Right: No tenderness.         Left: No tenderness.       Rectum: External hemorrhoid present.     Comments: Amount Discharge: small  Odor: No pH: greater than 4.5 Adheres to vaginal wall: No Color: color of discharge matches the Aleksey Newbern swab  Musculoskeletal:     Cervical back: Full passive range of motion without pain and normal range of motion.  Lymphadenopathy:     Cervical: No cervical adenopathy.     Right cervical: No superficial, deep or posterior cervical adenopathy.    Left cervical: No superficial, deep or posterior cervical adenopathy.     Upper Body:     Right upper body: No supraclavicular, axillary or epitrochlear adenopathy.     Left upper body: No supraclavicular, axillary or epitrochlear adenopathy.     Lower Body: No right inguinal adenopathy. No left inguinal adenopathy.  Skin:    General: Skin is warm and dry.     Findings: No erythema, laceration, lesion or rash.  Neurological:     Mental Status: She is alert and oriented to person, place, and time.  Psychiatric:        Attention and Perception: Attention normal.        Mood and Affect: Mood normal.        Speech: Speech normal.         Behavior: Behavior normal. Behavior is cooperative.       Assessment and Plan:  Catherine Wolfe is a 36 y.o. female presenting to the [facility] Department for an initial annual wellness/contraceptive visit  Contraception counseling: Reviewed options based on patient desire and reproductive life plan. Patient is interested in IUD or IUS.   Risks, benefits, and typical effectiveness rates were reviewed.  Questions were answered.  Written information  was also given to the patient to review.    The patient will follow up in  1 years for surveillance.  The patient was told to call with any further questions, or with any concerns about this method of contraception.  Emphasized use of condoms 100% of the time for STI prevention.  Need for ECP was assessed.   ECP not offered due to continue use of birth control.     1. Family planning counseling -36 year old female in clinic today for a physical and STD screening. -ROS reviewed, patient states she has had some dizziness and headaches. 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  No additional follow up needed at this times.  Advised to follow up with PCP if symptoms worsen.   -Patient currently has an IUD in place will continue with birth control method.  -Patient agrees to STD testing.   2. Well woman exam with routine gynecological exam -Normal well woman exam. -PAP today. -CBE today, next due 02/2025 - IGP, Aptima HPV  3. Screening examination for venereal disease -STD screening today. -Patient accepted all screenings including oral, vaginal, rectal CT/GC and declines bloodwork for HIV/RPR.  Patient meets criteria for HepB screening? No. Ordered? No, low risk  Patient meets criteria for HepC screening? No. Ordered? No - low risk   Treat wet prep per standing order Discussed time line for State Lab results and that patient will be called with positive  results and encouraged patient to call if she had not heard in 2 weeks.  Counseled to return or seek care for continued or worsening symptoms Recommended condom use with all sex  Patient is currently using  IUD  to prevent pregnancy.    - Chlamydia/Gonorrhea [place] [facility] - Chlamydia/Gonorrhea [place] [facility] - Chlamydia/Gonorrhea [place] [facility] - WET PREP FOR TRICH, YEAST, CLUE    Return in about 1 year (around 02/19/2023) for Annual well-woman exam.  Total time spent: 30 minutes   Glenna Fellows, FNP

## 2022-02-18 NOTE — Progress Notes (Signed)
Pt here for PE, Pap and STD screening.  Wet mount results reviewed, no treatment required per SO.  FP packet given to pt.  Berdie Ogren, RN

## 2022-02-23 LAB — IGP, APTIMA HPV
HPV Aptima: POSITIVE — AB
PAP Smear Comment: 0

## 2022-03-08 ENCOUNTER — Encounter: Payer: Self-pay | Admitting: Nurse Practitioner

## 2022-03-08 NOTE — Progress Notes (Signed)
Abstracting record. Catherine Cromer, FNP

## 2022-03-28 NOTE — Addendum Note (Signed)
Addended by: Gregary Cromer on: 03/28/2022 06:28 PM   Modules accepted: Level of Service

## 2022-04-22 ENCOUNTER — Other Ambulatory Visit (HOSPITAL_COMMUNITY)
Admission: RE | Admit: 2022-04-22 | Discharge: 2022-04-22 | Disposition: A | Discharge status: Home / Self Care | Payer: Medicaid Other | Source: Ambulatory Visit | Attending: Obstetrics & Gynecology | Admitting: Obstetrics & Gynecology

## 2022-04-22 ENCOUNTER — Ambulatory Visit (INDEPENDENT_AMBULATORY_CARE_PROVIDER_SITE_OTHER): Payer: Medicaid Other | Admitting: Obstetrics & Gynecology

## 2022-04-22 ENCOUNTER — Encounter: Payer: Self-pay | Admitting: Obstetrics & Gynecology

## 2022-04-22 VITALS — BP 101/68 | HR 74 | Ht 64.0 in | Wt 136.0 lb

## 2022-04-22 DIAGNOSIS — R87610 Atypical squamous cells of undetermined significance on cytologic smear of cervix (ASC-US): Secondary | ICD-10-CM | POA: Diagnosis not present

## 2022-04-22 DIAGNOSIS — Z3202 Encounter for pregnancy test, result negative: Secondary | ICD-10-CM | POA: Diagnosis not present

## 2022-04-22 DIAGNOSIS — N72 Inflammatory disease of cervix uteri: Secondary | ICD-10-CM

## 2022-04-22 DIAGNOSIS — Z23 Encounter for immunization: Secondary | ICD-10-CM

## 2022-04-22 DIAGNOSIS — R87810 Cervical high risk human papillomavirus (HPV) DNA test positive: Secondary | ICD-10-CM | POA: Diagnosis not present

## 2022-04-22 DIAGNOSIS — Z975 Presence of (intrauterine) contraceptive device: Secondary | ICD-10-CM

## 2022-04-22 LAB — POCT URINE PREGNANCY: Preg Test, Ur: NEGATIVE

## 2022-04-22 NOTE — Progress Notes (Signed)
   New Patient Office Visit  Subjective   Patient ID: Catherine Wolfe, female    DOB: [birth year]  Age: 36 y.o. MRN: [id number]  Chief Complaint  Patient presents with   Colposcopy    HPI   36 yo single P2 (9 and 18 yo kids) here for a colpo as a new patient. She is referred by the health dept for an ASCUS + HR HPV pap. She has not been sexually active since June 2023. She had negative STI testing 02/2022.  She reports an abnormal pap smear about 5 years ago and had a colpo, no further treatment, negative pap smears until this recent one.  She uses Paragard IUD for contraception.   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  Objective:     BP 101/68   Pulse 74   Ht 5\' 4"  (1.626 m)   Wt 136 lb (61.7 kg)   LMP 04/06/2022   BMI 23.34 kg/m    Physical Exam   Results for orders placed or performed in visit on 04/22/22  POCT urine pregnancy  Result Value Ref Range   Preg Test, Ur Negative Negative     Well nourished, well hydrated Black female, no apparent distress She is ambulating and conversing normally. UPT negative, consent signed, time out done Cervix prepped with acetic acid. Transformation zone seen in its entirety. Colpo adequate. Acetowhite changes and very faint mosacism seen on the anterior lip of the cervix. I biopsied at the 11 o'clock position. Silver nitrate was used to achieve hemostasis. IUD strings visible. ECC obtained. She tolerated the procedure well.  Assessment & Plan:   Problem List Items Addressed This Visit   None Visit Diagnoses     Pregnancy test negative    -  Primary   Relevant Orders   POCT urine pregnancy (Completed)      Colpo c/w LGSIL- I told her that if the ECC is positive, she will need a LEEP. If ECC is negative and cervical biopsy is negative she could do watchful waiting or do a cryo.  She will start Gardasil series today, come back in 2 and 6 months  I will send her a message next week about the results   04/24/22, MD

## 2022-04-23 ENCOUNTER — Ambulatory Visit: Payer: Medicaid Other

## 2022-04-23 ENCOUNTER — Encounter: Payer: Self-pay | Admitting: Obstetrics & Gynecology

## 2022-04-24 LAB — SURGICAL PATHOLOGY

## 2022-04-29 ENCOUNTER — Encounter: Payer: Self-pay | Admitting: Obstetrics & Gynecology

## 2022-06-20 ENCOUNTER — Ambulatory Visit: Payer: Medicaid Other

## 2022-06-20 NOTE — Progress Notes (Deleted)
   NURSE VISIT NOTE  Subjective:    Patient ID: Catherine Wolfe, female    DOB: 05/29/1986, 37 y.o.   MRN: [id number]  HPI  Patient is a 37 y.o. G35P2002 female who presents for her second gardasil injection. She had her first injection on 04/22/2022.  The following portions of the patient's history were reviewed and updated as appropriate: allergies, current medications, past family history, past medical history, past social history, past surgical history, and problem list.  Review of Systems Pertinent items are noted in HPI.   Objective:   There were no vitals taken for this visit. There is no height or weight on file to calculate BMI.  General appearance: alert, cooperative, and no distress    Assessment:   1. Need for HPV vaccination   2. Erroneous encounter - disregard      Plan:   There are no diagnoses linked to this encounter.   -Follow up in 4 months for final gardasil injection.   Cristy Folks, [place]   This encounter was created in error - please disregard.

## 2022-06-20 NOTE — Patient Instructions (Signed)
HPV (Human Papillomavirus) Vaccine: What You Need to Know 1. Why get vaccinated? HPV (human papillomavirus) vaccine can prevent infection with some types of human papillomavirus. HPV infections can cause certain types of cancers, including: cervical, vaginal, and vulvar cancers in women penile cancer in men anal cancers in both men and women cancers of tonsils, base of tongue, and back of throat (oropharyngeal cancer) in both men and women HPV infections can also cause anogenital warts. HPV vaccine can prevent over 90% of cancers caused by HPV. HPV is spread through intimate skin-to-skin or sexual contact. HPV infections are so common that nearly all people will get at least one type of HPV at some time in their lives. Most HPV infections go away on their own within 2 years. But sometimes HPV infections will last longer and can cause cancers later in life. 2. HPV vaccine HPV vaccine is routinely recommended for adolescents at 11 or 37 years of age to ensure they are protected before they are exposed to the virus. HPV vaccine may be given beginning at age 9 years and vaccination is recommended for everyone through 37 years of age. HPV vaccine may be given to adults 27 through 37 years of age, based on discussions between the patient and health care provider. Most children who get the first dose before 15 years of age need 2 doses of HPV vaccine. People who get the first dose at or after 15 years of age and younger people with certain immunocompromising conditions need 3 doses. Your health care provider can give you more information. HPV vaccine may be given at the same time as other vaccines. 3. Talk with your health care provider Tell your vaccination provider if the person getting the vaccine: Has had an allergic reaction after a previous dose of HPV vaccine, or has any severe, life-threatening allergies Is pregnant--HPV vaccine is not recommended until after pregnancy In some cases, your health  care provider may decide to postpone HPV vaccination until a future visit. People with minor illnesses, such as a cold, may be vaccinated. People who are moderately or severely ill should usually wait until they recover before getting HPV vaccine. Your health care provider can give you more information. 4. Risks of a vaccine reaction Soreness, redness, or swelling where the shot is given can happen after HPV vaccination. Fever or headache can happen after HPV vaccination. People sometimes faint after medical procedures, including vaccination. Tell your provider if you feel dizzy or have vision changes or ringing in the ears. As with any medicine, there is a very remote chance of a vaccine causing a severe allergic reaction, other serious injury, or death. 5. What if there is a serious problem? An allergic reaction could occur after the vaccinated person leaves the clinic. If you see signs of a severe allergic reaction (hives, swelling of the face and throat, difficulty breathing, a fast heartbeat, dizziness, or weakness), call 9-1-1 and get the person to the nearest hospital. For other signs that concern you, call your health care provider. Adverse reactions should be reported to the Vaccine Adverse Event Reporting System (VAERS). Your health care provider will usually file this report, or you can do it yourself. Visit the VAERS website at www.vaers.hhs.gov or call 1-800-822-7967. VAERS is only for reporting reactions, and VAERS staff members do not give medical advice. 6. The National Vaccine Injury Compensation Program The National Vaccine Injury Compensation Program (VICP) is a federal program that was created to compensate people who may have been injured   by certain vaccines. Claims regarding alleged injury or death due to vaccination have a time limit for filing, which may be as short as two years. Visit the VICP website at www.hrsa.gov/vaccinecompensation or call 1-800-338-2382 to learn about the  program and about filing a claim. 7. How can I learn more? Ask your health care provider. Call your local or state health department. Visit the website of the Food and Drug Administration (FDA) for vaccine package inserts and additional information at www.fda.gov/vaccines-blood-biologics/vaccines. Contact the Centers for Disease Control and Prevention (CDC): Call 1-800-232-4636 (1-800-CDC-INFO) or Visit CDC's website at www.cdc.gov/vaccines. Source: CDC Vaccine Information Statement HPV Vaccine (01/14/2020) This same material is available at www.cdc.gov for no charge. This information is not intended to replace advice given to you by your health care provider. Make sure you discuss any questions you have with your health care provider. Document Revised: 04/23/2021 Document Reviewed: 02/15/2021 Elsevier Patient Education  2023 Elsevier Inc.  

## 2022-06-24 DIAGNOSIS — Z23 Encounter for immunization: Secondary | ICD-10-CM

## 2022-06-26 ENCOUNTER — Ambulatory Visit: Payer: Medicaid Other

## 2022-06-27 ENCOUNTER — Ambulatory Visit (INDEPENDENT_AMBULATORY_CARE_PROVIDER_SITE_OTHER): Payer: Medicaid Other

## 2022-06-27 VITALS — BP 107/49 | HR 74 | Ht 64.0 in | Wt 132.0 lb

## 2022-06-27 DIAGNOSIS — Z23 Encounter for immunization: Secondary | ICD-10-CM

## 2022-06-27 NOTE — Progress Notes (Signed)
    NURSE VISIT NOTE  Subjective:    Patient ID: Catherine Wolfe, female    DOB: 1985-06-28, 37 y.o.   MRN: [id number]  HPI  Patient is a 37 y.o. G49P2002  female who presents for her second Gardasil injection.   Objective:    There were no vitals taken for this visit. Given by: Levert Feinstein, CMA Site:  right deltoid    Assessment:   No diagnosis found.   Plan:   Patient will return in 4 months MAY for third injection.    Landis Gandy, [place]

## 2022-06-28 ENCOUNTER — Other Ambulatory Visit: Payer: Self-pay

## 2022-10-17 ENCOUNTER — Ambulatory Visit (LOCAL_COMMUNITY_HEALTH_CENTER): Payer: Medicaid Other | Admitting: Family Medicine

## 2022-10-17 ENCOUNTER — Encounter: Payer: Self-pay | Admitting: Family Medicine

## 2022-10-17 DIAGNOSIS — Z30432 Encounter for removal of intrauterine contraceptive device: Secondary | ICD-10-CM

## 2022-10-17 DIAGNOSIS — Z309 Encounter for contraceptive management, unspecified: Secondary | ICD-10-CM

## 2022-10-17 DIAGNOSIS — Z113 Encounter for screening for infections with a predominantly sexual mode of transmission: Secondary | ICD-10-CM

## 2022-10-17 LAB — WET PREP FOR TRICH, YEAST, CLUE
Trichomonas Exam: NEGATIVE
Yeast Exam: NEGATIVE

## 2022-10-17 LAB — HM HIV SCREENING LAB: HM HIV Screening: NEGATIVE

## 2022-10-17 NOTE — Progress Notes (Signed)
Pt is here for STD screening.  Wet mount results reviewed, no treatment required per standing order.  Condoms declined. Gaspar Garbe, RN

## 2022-10-17 NOTE — Progress Notes (Signed)
[facility] Department  STI clinic/screening visit [address] [place] [address] [phone]  Subjective:  Catherine Wolfe is a 37 y.o. female being seen today for an STI screening visit. The patient reports they do not have symptoms.  Patient reports that they do desire a pregnancy in the next year.   They reported they are not interested in discussing contraception today.    Patient's last menstrual period was 09/18/2022 (exact date).  Patient has the following medical conditions:   Patient Active Problem List   Diagnosis Date Noted   Herpes, genital 07/03/2009    Chief Complaint  Patient presents with   SEXUALLY TRANSMITTED DISEASE    STD screening    HPI  Patient reports to clinic for STI testing and to remove her IUD  Does the patient using douching products? No  Last HIV test per patient/review of record was  Lab Results  Component Value Date   HMHIVSCREEN Negative - Validated 12/25/2021   No results found for: "HIV" Patient reports last pap was No results found for: "DIAGPAP"  Lab Results  Component Value Date   SPECADGYN Comment 02/18/2022    Screening for MPX risk: Does the patient have an unexplained rash? No Is the patient MSM? No Does the patient endorse multiple sex partners or anonymous sex partners? No Did the patient have close or sexual contact with a person diagnosed with MPX? No Has the patient traveled outside the [place] where MPX is endemic? No Is there a high clinical suspicion for MPX-- evidenced by one of the following No  -Unlikely to be chickenpox  -Lymphadenopathy  -Rash that present in same phase of evolution on any given body part See flowsheet for further details and programmatic requirements.   Immunization history:  Immunization History  Administered Date(s) Administered   HPV 9-valent 04/22/2022, 06/27/2022   Hepatitis B, PED/ADOLESCENT 03/20/1998, 04/17/1998, 09/18/1998   PFIZER(Purple Top)SARS-COV-2  Vaccination 03/10/2020, 05/05/2020   Tdap 07/09/2010     The following portions of the patient's history were reviewed and updated as appropriate: allergies, current medications, past medical history, past social history, past surgical history and problem list.  Objective:  There were no vitals filed for this visit.  Physical Exam Vitals and nursing note reviewed.  Constitutional:      Appearance: Normal appearance.  HENT:     Head: Normocephalic and atraumatic.     Mouth/Throat:     Mouth: Mucous membranes are moist.     Pharynx: Oropharynx is clear. No oropharyngeal exudate or posterior oropharyngeal erythema.  Pulmonary:     Effort: Pulmonary effort is normal.  Abdominal:     General: Abdomen is flat.     Palpations: There is no mass.     Tenderness: There is no abdominal tenderness. There is no rebound.  Genitourinary:    General: Normal vulva.     Exam position: Lithotomy position.     Pubic Area: No rash or pubic lice.      Labia:        Right: No rash or lesion.        Left: No rash or lesion.      Vagina: Vaginal discharge present. No erythema, bleeding or lesions.     Cervix: No cervical motion tenderness, discharge, friability, lesion or erythema.     Uterus: Normal.      Adnexa: Right adnexa normal and left adnexa normal.     Rectum: Normal.     Comments: pH = 4  Small amt of yellow thin discharge  Lymphadenopathy:     Head:     Right side of head: No preauricular or posterior auricular adenopathy.     Left side of head: No preauricular or posterior auricular adenopathy.     Cervical: No cervical adenopathy.     Upper Body:     Right upper body: No supraclavicular, axillary or epitrochlear adenopathy.     Left upper body: No supraclavicular, axillary or epitrochlear adenopathy.     Lower Body: No right inguinal adenopathy. No left inguinal adenopathy.  Skin:    General: Skin is warm and dry.     Findings: No rash.  Neurological:     Mental Status: She is  alert and oriented to person, place, and time.      Assessment and Plan:  Federica Michelli is a 37 y.o. female presenting to the [facility] Department for STI screening  1. Screening for venereal disease  - Chlamydia/Gonorrhea [place] [facility] - HIV [place] [facility] - Syphilis Serology, [place] [facility] - WET PREP FOR TRICH, YEAST, CLUE - Gonococcus culture  2. Encounter for IUD removal   IUD Removal  Patient identified, informed consent performed, consent signed.  Patient was in the dorsal lithotomy position, normal external genitalia was noted.  A speculum was placed in the patient's vagina, normal discharge was noted, no lesions. The cervix was visualized, no lesions, no abnormal discharge.  The strings of the IUD were grasped and pulled using ring forceps. The IUD was removed in its entirety. Patient tolerated the procedure well.    Patient will use nothing for contraception. Plans for pregnancy soon and she was told to avoid teratogens, take PNV and folic acid.  Routine preventative health maintenance measures emphasized.     Patient accepted all screenings including oral, vaginal CT/GC and bloodwork for HIV/RPR, and wet prep. Patient meets criteria for HepB screening? No. Ordered? not applicable Patient meets criteria for HepC screening? No. Ordered? not applicable  Treat wet prep per standing order Discussed time line for State Lab results and that patient will be called with positive results and encouraged patient to call if she had not heard in 2 weeks.  Counseled to return or seek care for continued or worsening symptoms Recommended repeat testing in 3 months with positive results. Recommended condom use with all sex  Patient is currently using  nothing  to prevent pregnancy.    Return if symptoms worsen or fail to improve.  Future Appointments  Date Time Provider Department Center  10/28/2022  4:15 PM [facility]-NURSE [facility] None    Lenice Llamas, [place]

## 2022-10-21 LAB — GONOCOCCUS CULTURE

## 2022-10-28 ENCOUNTER — Ambulatory Visit (INDEPENDENT_AMBULATORY_CARE_PROVIDER_SITE_OTHER): Payer: Medicaid Other

## 2022-10-28 VITALS — BP 106/69 | HR 79 | Resp 16 | Ht 64.0 in | Wt 130.4 lb

## 2022-10-28 DIAGNOSIS — Z23 Encounter for immunization: Secondary | ICD-10-CM

## 2022-10-28 NOTE — Progress Notes (Signed)
    NURSE VISIT NOTE  Subjective:    Patient ID: Catherine Wolfe, female    DOB: 15-Jul-1985, 37 y.o.   MRN: [id number]  HPI  Patient is a 37 y.o. G51P2002 female Single African American female who presents for her third Gardasil injection. Order to administer given by Nicholaus Bloom, MD on 04/22/2022.   Objective:    BP 106/69   Pulse 79   Resp 16   Ht 5\' 4"  (1.626 m)   Wt 130 lb 6.4 oz (59.1 kg)   LMP 10/18/2022 (Exact Date)   BMI 22.38 kg/m   37 y.o. LMP:  10/18/2022 Contraception:   Paraguard IUD  Lab Review  @THIS  VISIT ONLY@  Assessment:   1. Need for HPV vaccination      Plan:   Patient will return in prn. Gardasil series complete.     Santiago Bumpers, CMA [facility]

## 2022-10-28 NOTE — Patient Instructions (Signed)
HPV (Human Papillomavirus) Vaccine: What You Need to Know 1. Why get vaccinated? HPV (human papillomavirus) vaccine can prevent infection with some types of human papillomavirus. HPV infections can cause certain types of cancers, including: cervical, vaginal, and vulvar cancers in women penile cancer in men anal cancers in both men and women cancers of tonsils, base of tongue, and back of throat (oropharyngeal cancer) in both men and women HPV infections can also cause anogenital warts. HPV vaccine can prevent over 90% of cancers caused by HPV. HPV is spread through intimate skin-to-skin or sexual contact. HPV infections are so common that nearly all people will get at least one type of HPV at some time in their lives. Most HPV infections go away on their own within 2 years. But sometimes HPV infections will last longer and can cause cancers later in life. 2. HPV vaccine HPV vaccine is routinely recommended for adolescents at 11 or 37 years of age to ensure they are protected before they are exposed to the virus. HPV vaccine may be given beginning at age 9 years and vaccination is recommended for everyone through 37 years of age. HPV vaccine may be given to adults 27 through 37 years of age, based on discussions between the patient and health care provider. Most children who get the first dose before 15 years of age need 2 doses of HPV vaccine. People who get the first dose at or after 15 years of age and younger people with certain immunocompromising conditions need 3 doses. Your health care provider can give you more information. HPV vaccine may be given at the same time as other vaccines. 3. Talk with your health care provider Tell your vaccination provider if the person getting the vaccine: Has had an allergic reaction after a previous dose of HPV vaccine, or has any severe, life-threatening allergies Is pregnant--HPV vaccine is not recommended until after pregnancy In some cases, your health  care provider may decide to postpone HPV vaccination until a future visit. People with minor illnesses, such as a cold, may be vaccinated. People who are moderately or severely ill should usually wait until they recover before getting HPV vaccine. Your health care provider can give you more information. 4. Risks of a vaccine reaction Soreness, redness, or swelling where the shot is given can happen after HPV vaccination. Fever or headache can happen after HPV vaccination. People sometimes faint after medical procedures, including vaccination. Tell your provider if you feel dizzy or have vision changes or ringing in the ears. As with any medicine, there is a very remote chance of a vaccine causing a severe allergic reaction, other serious injury, or death. 5. What if there is a serious problem? An allergic reaction could occur after the vaccinated person leaves the clinic. If you see signs of a severe allergic reaction (hives, swelling of the face and throat, difficulty breathing, a fast heartbeat, dizziness, or weakness), call 9-1-1 and get the person to the nearest hospital. For other signs that concern you, call your health care provider. Adverse reactions should be reported to the Vaccine Adverse Event Reporting System (VAERS). Your health care provider will usually file this report, or you can do it yourself. Visit the VAERS website at www.vaers.hhs.gov or call 1-800-822-7967. VAERS is only for reporting reactions, and VAERS staff members do not give medical advice. 6. The National Vaccine Injury Compensation Program The National Vaccine Injury Compensation Program (VICP) is a federal program that was created to compensate people who may have been injured   by certain vaccines. Claims regarding alleged injury or death due to vaccination have a time limit for filing, which may be as short as two years. Visit the VICP website at www.hrsa.gov/vaccinecompensation or call 1-800-338-2382 to learn about the  program and about filing a claim. 7. How can I learn more? Ask your health care provider. Call your local or state health department. Visit the website of the Food and Drug Administration (FDA) for vaccine package inserts and additional information at www.fda.gov/vaccines-blood-biologics/vaccines. Contact the Centers for Disease Control and Prevention (CDC): Call 1-800-232-4636 (1-800-CDC-INFO) or Visit CDC's website at www.cdc.gov/vaccines. Source: CDC Vaccine Information Statement HPV Vaccine (01/14/2020) This same material is available at www.cdc.gov for no charge. This information is not intended to replace advice given to you by your health care provider. Make sure you discuss any questions you have with your health care provider. Document Revised: 03/06/2022 Document Reviewed: 03/06/2022 Elsevier Patient Education  2023 Elsevier Inc.  

## 2022-11-27 ENCOUNTER — Ambulatory Visit
Admission: RE | Admit: 2022-11-27 | Discharge: 2022-11-27 | Disposition: A | Discharge status: Home / Self Care | Payer: Medicaid Other | Source: Ambulatory Visit | Attending: Emergency Medicine | Admitting: Emergency Medicine

## 2022-11-27 VITALS — BP 102/78 | HR 83 | Temp 98.5°F | Resp 16

## 2022-11-27 DIAGNOSIS — J069 Acute upper respiratory infection, unspecified: Secondary | ICD-10-CM

## 2022-11-27 LAB — PREGNANCY, URINE: Preg Test, Ur: NEGATIVE

## 2022-11-27 NOTE — ED Provider Notes (Signed)
[facility]    CSN: [id number] Arrival date & time: 11/27/22  1059      History   Chief Complaint Chief Complaint  Patient presents with   Fatigue   Nausea   Headache    HPI Catherine Wolfe is a 37 y.o. female.   HPI  37 year old female with past medical history significant for genital herpes presents for evaluation of headache, nausea, and fatigue that is been going on for the past 2 days.  She is also had a runny nose and nasal congestion for which she has been taking allergy medicine.  She denies fever, sore throat, cough, vomiting, or diarrhea.  Patient concerned she may be pregnant and is requesting a pregnancy test.  Past Medical History:  Diagnosis Date   Vaginal Pap smear, abnormal 08/2017   at [facility]    Patient Active Problem List   Diagnosis Date Noted   Herpes, genital 07/03/2009    Past Surgical History:  Procedure Laterality Date   NO PAST SURGERIES      OB History     Gravida  2   Para  2   Term  2   Preterm      AB      Living  2      SAB      IAB      Ectopic      Multiple      Live Births  2            Home Medications    Prior to Admission medications   Medication Sig Start Date End Date Taking? Authorizing Provider  ferrous sulfate 325 (65 FE) MG tablet Take 325 mg by mouth every other day. 03/23/22   [provider]  fluticasone (FLONASE) 50 MCG/ACT nasal spray Place 2 sprays into both nostrils daily. 09/18/22   [provider]  sertraline (ZOLOFT) 25 MG tablet Take 25 mg by mouth daily. 04/06/22   [provider]    Family History Family History  Problem Relation Age of Onset   Lung cancer Paternal Grandmother    Breast cancer Maternal Grandmother    Alcohol abuse Father    Healthy Mother    Healthy Sister    Healthy Sister    Breast cancer Paternal Aunt    Healthy Son    Healthy Daughter    Diabetes Cousin    Diabetes Cousin    Diabetes Maternal  Aunt     Social History Social History   Tobacco Use   Smoking status: Never    Passive exposure: Never   Smokeless tobacco: Never  Vaping Use   Vaping Use: Never used  Substance Use Topics   Alcohol use: Yes    Alcohol/week: 1.0 standard drink of alcohol    Types: 1 Cans of beer per week    Comment: on occasions last ETOH 02/17/2022   Drug use: No     Allergies   Penicillins   Review of Systems Review of Systems  Constitutional:  Positive for fatigue. Negative for fever.  HENT:  Positive for congestion and rhinorrhea. Negative for ear pain and sore throat.   Respiratory:  Negative for cough.   Gastrointestinal:  Positive for nausea. Negative for diarrhea and vomiting.     Physical Exam Triage Vital Signs ED Triage Vitals  Enc Vitals Group     BP 11/27/22 1154 102/78     Pulse Rate 11/27/22 1154 83     Resp 11/27/22  1154 16     Temp 11/27/22 1154 98.5 F (36.9 C)     Temp Source 11/27/22 1154 Oral     SpO2 11/27/22 1154 100 %     Weight --      Height --      Head Circumference --      Peak Flow --      Pain Score 11/27/22 1152 8     Pain Loc --      Pain Edu? --      Excl. in GC? --    No data found.  Updated Vital Signs BP 102/78 (BP Location: Left Arm)   Pulse 83   Temp 98.5 F (36.9 C) (Oral)   Resp 16   LMP  (LMP Unknown)   SpO2 100%   Visual Acuity Right Eye Distance:   Left Eye Distance:   Bilateral Distance:    Right Eye Near:   Left Eye Near:    Bilateral Near:     Physical Exam Vitals and nursing note reviewed.  Constitutional:      Appearance: Normal appearance. She is not ill-appearing.  HENT:     Head: Normocephalic and atraumatic.     Right Ear: Tympanic membrane, ear canal and external ear normal. There is no impacted cerumen.     Left Ear: Tympanic membrane, ear canal and external ear normal. There is no impacted cerumen.     Nose: Congestion and rhinorrhea present.     Comments: Nasal mucosa is erythematous and  edematous with clear discharge in both nares.    Mouth/Throat:     Mouth: Mucous membranes are moist.     Pharynx: Oropharynx is clear. Posterior oropharyngeal erythema present. No oropharyngeal exudate.     Comments: Mild erythema to the posterior oropharynx with clear postnasal drip. Cardiovascular:     Rate and Rhythm: Normal rate and regular rhythm.     Pulses: Normal pulses.     Heart sounds: Normal heart sounds. No murmur heard.    No friction rub. No gallop.  Pulmonary:     Effort: Pulmonary effort is normal.     Breath sounds: Normal breath sounds. No wheezing, rhonchi or rales.  Musculoskeletal:     Cervical back: Normal range of motion and neck supple. No tenderness.  Lymphadenopathy:     Cervical: No cervical adenopathy.  Skin:    General: Skin is warm and dry.     Capillary Refill: Capillary refill takes less than 2 seconds.     Findings: No rash.  Neurological:     General: No focal deficit present.     Mental Status: She is alert and oriented to person, place, and time.      UC Treatments / Results  Labs (all labs ordered are listed, but only abnormal results are displayed) Labs Reviewed  PREGNANCY, URINE    EKG   Radiology No results found.  Procedures Procedures (including critical care time)  Medications Ordered in UC Medications - No data to display  Initial Impression / Assessment and Plan / UC Course  I have reviewed the triage vital signs and the nursing notes.  Pertinent labs & imaging results that were available during my care of the patient were reviewed by me and considered in my medical decision making (see chart for details).   The patient is a pleasant, nontoxic-appearing 37 year old female presenting for evaluation of URI symptoms as outlined HPI above.  She initially was concerned that she may be pregnant requesting a  pregnancy test.  Urine pregnancy test was collected at triage and is negative.  Her physical exam does reveal inflamed  nasal mucosa with clear rhinorrhea as well as erythema to the posterior oropharynx with clear postnasal drip.  No cervical lymphadenopathy appreciable exam.  Cardiopulmonary exam is benign.  The patient exam is consistent with a viral URI.  I did offer the patient COVID testing, which she declined at this time.  I will discharge her home and have her use over-the-counter Tylenol and/or ibuprofen according to the package instructions as needed for headache.  Push fluids, rest, and return for any new or worsening symptoms.  Work note provided.   Final Clinical Impressions(s) / UC Diagnoses   Final diagnoses:  Viral URI     Discharge Instructions      Use OTC Tylenol and/or Ibuprofen as needed for headache.  Rest.  Push fluids.  If you develop any new or worsening symptoms return for re-evaluation.      ED Prescriptions   None    PDMP not reviewed this encounter.   Becky Augusta, NP 11/27/22 1220

## 2022-11-27 NOTE — Discharge Instructions (Signed)
Use OTC Tylenol and/or Ibuprofen as needed for headache.  Rest.  Push fluids.  If you develop any new or worsening symptoms return for re-evaluation.

## 2022-11-27 NOTE — ED Triage Notes (Signed)
Pt presents with a headache, nausea and fatigue x 2 days. Pt would also like a pregnancy test.

## 2023-03-20 ENCOUNTER — Ambulatory Visit: Payer: Medicaid Other | Admitting: Family Medicine

## 2023-03-20 DIAGNOSIS — Z113 Encounter for screening for infections with a predominantly sexual mode of transmission: Secondary | ICD-10-CM

## 2023-03-20 DIAGNOSIS — B9689 Other specified bacterial agents as the cause of diseases classified elsewhere: Secondary | ICD-10-CM

## 2023-03-20 LAB — HM HIV SCREENING LAB: HM HIV Screening: NEGATIVE

## 2023-03-20 LAB — WET PREP FOR TRICH, YEAST, CLUE
Trichomonas Exam: NEGATIVE
Yeast Exam: NEGATIVE

## 2023-03-20 MED ORDER — METRONIDAZOLE 500 MG PO TABS
500.0000 mg | ORAL_TABLET | Freq: Two times a day (BID) | ORAL | Status: AC
Start: 2023-03-20 — End: 2023-03-27

## 2023-03-20 NOTE — Progress Notes (Signed)
Pt is here for STD screening.  Wet mount results reviewed.  The patient was dispensed Metronidazole 500 mg #14 today. I provided counseling today regarding the medication. We discussed the medication, the side effects and when to call clinic. Patient given the opportunity to ask questions. Questions answered.  Condoms declined.  Berdie Ogren, RN

## 2023-03-20 NOTE — Progress Notes (Signed)
[facility] Department  STI clinic/screening visit [address] [place] [address] [phone]  Subjective:  Catherine Wolfe is a 37 y.o. female being seen today for an STI screening visit. The patient reports they do have symptoms.  Patient reports that they do desire a pregnancy in the next year.   They reported they are not interested in discussing contraception today.    Patient's last menstrual period was 03/06/2023 (exact date).  Patient has the following medical conditions:   Patient Active Problem List   Diagnosis Date Noted   Herpes, genital 07/03/2009    Chief Complaint  Patient presents with   SEXUALLY TRANSMITTED DISEASE    No symptoms     HPI  Patient reports to clinic for STI tesing. Reports she has genital itching x 2 days- states she thinks she may have a yeast infection  Does the patient using douching products? No  Last HIV test per patient/review of record was  Lab Results  Component Value Date   HMHIVSCREEN Negative - Validated 10/17/2022   No results found for: "HIV"   Last HEPC test per patient/review of record was No results found for: "HMHEPCSCREEN" No components found for: "HEPC"   Last HEPB test per patient/review of record was No components found for: "HMHEPBSCREEN" No components found for: "HEPC"   Patient reports last pap was No results found for: "DIAGPAP"  Lab Results  Component Value Date   SPECADGYN Comment 02/18/2022    Screening for MPX risk: Does the patient have an unexplained rash? No Is the patient MSM? No Does the patient endorse multiple sex partners or anonymous sex partners? No Did the patient have close or sexual contact with a person diagnosed with MPX? No Has the patient traveled outside the [place] where MPX is endemic? No Is there a high clinical suspicion for MPX-- evidenced by one of the following No  -Unlikely to be chickenpox  -Lymphadenopathy  -Rash that present in same phase of evolution on  any given body part See flowsheet for further details and programmatic requirements.   Immunization history:  Immunization History  Administered Date(s) Administered   HPV 9-valent 04/22/2022, 06/27/2022, 10/28/2022   Hepatitis B, PED/ADOLESCENT 03/20/1998, 04/17/1998, 09/18/1998   PFIZER(Purple Top)SARS-COV-2 Vaccination 03/10/2020, 05/05/2020   Tdap 07/09/2010     The following portions of the patient's history were reviewed and updated as appropriate: allergies, current medications, past medical history, past social history, past surgical history and problem list.  Objective:  There were no vitals filed for this visit.  Physical Exam Vitals and nursing note reviewed. Exam conducted with a chaperone present Monna Fam, RN).  Constitutional:      Appearance: Normal appearance.  HENT:     Head: Normocephalic and atraumatic.     Mouth/Throat:     Mouth: Mucous membranes are moist.     Pharynx: Oropharynx is clear. No oropharyngeal exudate or posterior oropharyngeal erythema.  Pulmonary:     Effort: Pulmonary effort is normal.  Abdominal:     General: Abdomen is flat.     Palpations: There is no mass.     Tenderness: There is no abdominal tenderness. There is no rebound.  Genitourinary:    General: Normal vulva.     Exam position: Lithotomy position.     Pubic Area: No rash or pubic lice.      Labia:        Right: No rash or lesion.        Left: No  rash or lesion.      Vagina: Vaginal discharge present. No erythema, bleeding or lesions.     Cervix: No cervical motion tenderness, discharge, friability, lesion or erythema.     Uterus: Normal.      Adnexa: Right adnexa normal and left adnexa normal.     Rectum: Normal.     Comments: pH = 4  White discharge present Lymphadenopathy:     Head:     Right side of head: No preauricular or posterior auricular adenopathy.     Left side of head: No preauricular or posterior auricular adenopathy.     Cervical: No cervical  adenopathy.     Upper Body:     Right upper body: No supraclavicular, axillary or epitrochlear adenopathy.     Left upper body: No supraclavicular, axillary or epitrochlear adenopathy.     Lower Body: No right inguinal adenopathy. No left inguinal adenopathy.  Skin:    General: Skin is warm and dry.     Findings: No rash.  Neurological:     Mental Status: She is alert and oriented to person, place, and time.      Assessment and Plan:  Catherine Wolfe is a 37 y.o. female presenting to the [facility] Department for STI screening  1. Screening for venereal disease  - Chlamydia/Gonorrhea [place] [facility] - HIV [place] [facility] - Syphilis Serology, [place] [facility] - WET PREP FOR TRICH, YEAST, CLUE   Patient accepted all screenings including vaginal CT/GC and bloodwork for HIV/RPR, and wet prep. Patient meets criteria for HepB screening? No. Ordered? not applicable Patient meets criteria for HepC screening? No. Ordered? not applicable  Treat wet prep per standing order Discussed time line for State Lab results and that patient will be called with positive results and encouraged patient to call if she had not heard in 2 weeks.  Counseled to return or seek care for continued or worsening symptoms Recommended repeat testing in 3 months with positive results. Recommended condom use with all sex  Patient is currently using  nothing  to prevent pregnancy.    Return if symptoms worsen or fail to improve, for STI screening.  No future appointments. Total time spent 20 minutes   Lenice Llamas, [place]

## 2023-03-20 NOTE — Addendum Note (Signed)
Addended by: Berdie Ogren on: 03/20/2023 02:34 PM   Modules accepted: Orders

## 2023-07-11 ENCOUNTER — Encounter: Payer: Self-pay | Admitting: Advanced Practice Midwife

## 2023-07-11 ENCOUNTER — Ambulatory Visit: Payer: Medicaid Other | Admitting: Advanced Practice Midwife

## 2023-07-11 DIAGNOSIS — Z113 Encounter for screening for infections with a predominantly sexual mode of transmission: Secondary | ICD-10-CM

## 2023-07-11 DIAGNOSIS — F325 Major depressive disorder, single episode, in full remission: Secondary | ICD-10-CM | POA: Insufficient documentation

## 2023-07-11 DIAGNOSIS — R87619 Unspecified abnormal cytological findings in specimens from cervix uteri: Secondary | ICD-10-CM | POA: Insufficient documentation

## 2023-07-11 DIAGNOSIS — N760 Acute vaginitis: Secondary | ICD-10-CM

## 2023-07-11 DIAGNOSIS — R87610 Atypical squamous cells of undetermined significance on cytologic smear of cervix (ASC-US): Secondary | ICD-10-CM

## 2023-07-11 LAB — WET PREP FOR TRICH, YEAST, CLUE
Trichomonas Exam: NEGATIVE
Yeast Exam: NEGATIVE

## 2023-07-11 LAB — HM HIV SCREENING LAB: HM HIV Screening: NEGATIVE

## 2023-07-11 LAB — HEPATITIS B SURFACE ANTIGEN

## 2023-07-11 LAB — HM HEPATITIS C SCREENING LAB: HM Hepatitis Screen: NEGATIVE

## 2023-07-11 MED ORDER — METRONIDAZOLE 500 MG PO TABS: 500.0000 mg | ORAL_TABLET | Freq: Two times a day (BID) | ORAL | Status: AC

## 2023-07-11 NOTE — Addendum Note (Signed)
Addended by: Berdie Ogren on: 07/11/2023 12:15 PM   Modules accepted: Orders

## 2023-07-11 NOTE — Progress Notes (Signed)
Pt is here for STD screening.  Wet mount results reviewed.  The patient was dispensed Metronidazole 500 mg #14 today. I provided counseling today regarding the medication. We discussed the medication, the side effects and when to call clinic. Patient given the opportunity to ask questions. Questions answered.  Condoms given.  Kellsey Sansone M Travone Georg, RN  

## 2023-07-11 NOTE — Progress Notes (Signed)
[facility] Department STI clinic [address], [address] [place] [address] Main phone: [phone]  STI screening visit  Subjective:  Catherine Wolfe is a 38 y.o.SBF exsmoker G2P2  female being seen today for an STI screening visit. The patient reports they do not have symptoms.  Patient reports that they do not desire a pregnancy in the next year.   They reported they are not interested in discussing contraception today.    Patient's last menstrual period was 06/29/2023.  Patient has the following medical conditions:  Patient Active Problem List   Diagnosis Date Noted   Abnormal Pap smear of cervix 07/11/2023   Herpes, genital 07/03/2009    Chief Complaint  Patient presents with   SEXUALLY TRANSMITTED DISEASE    No symptoms routine check    HPI HPI Patient reports asymptomatic. LMP 06/29/23. Last sex 06/19/23 without condom; with current partner for the second time; 2 sex partners in last 3 months. Last cig 2018. Last MJ 2018. Last ETOH 03/14/23 (2 mixed drinks). Hx abnormal paps x2 with no f/u since colpo 04/22/22  Does the patient using douching products? No  Last HIV test per patient/review of record was  Lab Results  Component Value Date   HMHIVSCREEN Negative - Validated 03/20/2023   No results found for: "HIV"   Last HEPC test per patient/review of record was No results found for: "HMHEPCSCREEN" No components found for: "HEPC"   Last HEPB test per patient/review of record was No components found for: "HMHEPBSCREEN"   Patient reports last pap was: 09/04/20 neg HPV +; 02/18/22 ASCUS HPV+; colpo/bx 04/22/22 neg with no f/u since No results found for: "DIAGPAP", "HPVHIGH", "ADEQPAP" Lab Results  Component Value Date   SPECADGYN Comment 02/18/2022   Result Date Procedure Results Follow-ups  04/22/2022 Surgical pathology SURGICAL PATHOLOGY: SURGICAL PATHOLOGY CASE: [id number] PATIENT: Catherine Wolfe Surgical Pathology Report     Clinical  History: ASCUS, + HR HPV (cm)     FINAL MICROSCOPIC DIAGNOSIS:  A. CERVIX, 11:00, BIOPSY: Minute fragment of squamous mucosa with denuded ...   02/18/2022 IGP, Aptima HPV DIAGNOSIS:: Comment (A) Test Methodology: Comment HPV Aptima: Positive (A) Recommendation:: Comment (A) Specimen adequacy:: Comment Clinician Provided ICD10: Comment Performed by:: Comment Electronically signed by:: Comment PAP Smear Comment: . PATHOLOGIST PROVIDED ICD10:: Comment Note:: Comment   09/04/2020 HM PAP SMEAR HM Pap smear: NIL, positive HPV   02/23/2019 HM PAP SMEAR HM Pap smear: NIL   09/01/2017 HM PAP SMEAR HM Pap smear: Normal, HPV positive     Screening for MPX risk: Does the patient have an unexplained rash? No Is the patient MSM? No Does the patient endorse multiple sex partners or anonymous sex partners? Yes Did the patient have close or sexual contact with a person diagnosed with MPX? No Has the patient traveled outside the [place] where MPX is endemic? No Is there a high clinical suspicion for MPX-- evidenced by one of the following No  -Unlikely to be chickenpox  -Lymphadenopathy  -Rash that present in same phase of evolution on any given body part See flowsheet for further details and programmatic requirements.   Immunization history:  Immunization History  Administered Date(s) Administered   HPV 9-valent 04/22/2022, 06/27/2022, 10/28/2022   Hepatitis B, PED/ADOLESCENT 03/20/1998, 04/17/1998, 09/18/1998   PFIZER(Purple Top)SARS-COV-2 Vaccination 03/10/2020, 05/05/2020   Tdap 07/09/2010     The following portions of the patient's history were reviewed and updated as appropriate: allergies, current medications, past medical history, past social history, past  surgical history and problem list.  Objective:  There were no vitals filed for this visit.  Physical Exam Vitals and nursing note reviewed.  Constitutional:      Appearance: Normal appearance. She is normal weight.  HENT:      Head: Normocephalic and atraumatic.     Mouth/Throat:     Mouth: Mucous membranes are moist.     Pharynx: Oropharynx is clear. No oropharyngeal exudate or posterior oropharyngeal erythema.  Eyes:     Conjunctiva/sclera: Conjunctivae normal.  Pulmonary:     Effort: Pulmonary effort is normal.  Abdominal:     General: Abdomen is flat.     Palpations: Abdomen is soft. There is no mass.     Tenderness: There is no abdominal tenderness. There is no rebound.     Comments: Soft without masses or tenderness, good tone  Genitourinary:    General: Normal vulva.     Exam position: Lithotomy position.     Pubic Area: No rash or pubic lice.      Labia:        Right: No rash or lesion.        Left: No rash or lesion.      Vagina: Vaginal discharge (white creamy leukorrhea, ph<4.5) present. No erythema, bleeding or lesions.     Cervix: Normal. No cervical motion tenderness, discharge, friability, lesion or erythema.     Uterus: Normal.      Adnexa: Right adnexa normal and left adnexa normal.     Rectum: Normal.     Comments: pH = <4.5 Pt declines chaperone for exam Lymphadenopathy:     Head:     Right side of head: No preauricular or posterior auricular adenopathy.     Left side of head: No preauricular or posterior auricular adenopathy.     Cervical: No cervical adenopathy.     Right cervical: No superficial, deep or posterior cervical adenopathy.    Left cervical: No superficial, deep or posterior cervical adenopathy.     Upper Body:     Right upper body: No supraclavicular, axillary or epitrochlear adenopathy.     Left upper body: No supraclavicular, axillary or epitrochlear adenopathy.     Lower Body: No right inguinal adenopathy. No left inguinal adenopathy.  Skin:    General: Skin is warm and dry.     Findings: No rash.  Neurological:     Mental Status: She is alert and oriented to person, place, and time.     Assessment and Plan:  Catherine Wolfe is a 38 y.o. female presenting to  the [facility] Department for STI screening  1. Screening examination for venereal disease (Primary) Treat wet mount per standing orders Immunization nurse consult  - WET PREP FOR TRICH, YEAST, CLUE - Chlamydia/Gonorrhea [place] [facility] - HIV/HCV [place] [facility] - HBV Antigen/Antibody [facility] - Syphilis Serology, [facility]  2. Atypical squamous cells of undetermined significance on cytologic smear of cervix (ASC-US) No f/u since colpo on 04/22/22   Patient accepted all screenings including vaginal CT/GC and bloodwork for HIV/RPR, and wet prep. Patient meets criteria for HepB screening? Yes. Ordered? yes Patient meets criteria for HepC screening? Yes. Ordered? yes  Treat wet prep per standing order Discussed time line for [facility] results and that patient will be called with positive results and encouraged patient to call if she had not heard in 2 weeks.  Counseled to return or seek care for continued or worsening symptoms Recommended repeat testing  in 3 months with positive results. Recommended condom use with all sex for STI prevention.   Patient is currently using  nothing  to prevent pregnancy.    No follow-ups on file.  No future appointments.  Alberteen Spindle, CNM

## 2023-08-04 ENCOUNTER — Ambulatory Visit: Payer: Self-pay

## 2023-09-09 HISTORY — PX: TUMOR REMOVAL: SHX12

## 2023-12-03 ENCOUNTER — Ambulatory Visit: Admitting: Family Medicine

## 2023-12-03 ENCOUNTER — Encounter: Payer: Self-pay | Admitting: Family Medicine

## 2023-12-03 VITALS — BP 110/66 | HR 86 | Ht 64.0 in | Wt 128.4 lb

## 2023-12-03 DIAGNOSIS — Z309 Encounter for contraceptive management, unspecified: Secondary | ICD-10-CM | POA: Diagnosis not present

## 2023-12-03 DIAGNOSIS — N63 Unspecified lump in unspecified breast: Secondary | ICD-10-CM

## 2023-12-03 DIAGNOSIS — B9689 Other specified bacterial agents as the cause of diseases classified elsewhere: Secondary | ICD-10-CM

## 2023-12-03 DIAGNOSIS — Z01419 Encounter for gynecological examination (general) (routine) without abnormal findings: Secondary | ICD-10-CM

## 2023-12-03 DIAGNOSIS — Z3009 Encounter for other general counseling and advice on contraception: Secondary | ICD-10-CM

## 2023-12-03 DIAGNOSIS — R87619 Unspecified abnormal cytological findings in specimens from cervix uteri: Secondary | ICD-10-CM

## 2023-12-03 DIAGNOSIS — Z113 Encounter for screening for infections with a predominantly sexual mode of transmission: Secondary | ICD-10-CM

## 2023-12-03 LAB — HM HIV SCREENING LAB: HM HIV Screening: NEGATIVE

## 2023-12-03 LAB — WET PREP FOR TRICH, YEAST, CLUE
Clue Cell Exam: NEGATIVE
Trichomonas Exam: NEGATIVE
Yeast Exam: NEGATIVE

## 2023-12-03 MED ORDER — METRONIDAZOLE 500 MG PO TABS: 500.0000 mg | ORAL_TABLET | Freq: Two times a day (BID) | ORAL | Status: AC

## 2023-12-03 NOTE — Progress Notes (Signed)
 [employer] DEPARTMENT [facility] [address] [place] [address] Main phone: [phone]  Family Planning Visit - Repeat Yearly Visit  Subjective:  Catherine Wolfe is a 38 y.o. G2P2002  being seen today for an annual wellness visit and for STI screening. The patient is currently using no method - no contraceptive precautions for pregnancy prevention. Patient does not want a pregnancy in the next year.   Patient reports they do not want a contraceptive method today, recently had an IUD removed and does not want a different method at this time. Has concern regarding vaginal discharge/irritation/itching, as well as a concern for breast lumps.  Patient has the following medical problems:  Patient Active Problem List   Diagnosis Date Noted   Abnormal Pap smear of cervix 07/11/2023   Depression/anxiety 2023 07/11/2023   Herpes, genital 07/03/2009   Chief Complaint  Patient presents with   Annual Exam    Pt is here PE     HPI Patient reports bothersome white, itchy vaginal discharge and feels her vulva/vagina are swollen and irritated. This has been occurring for a few weeks. Also c/o breast lumps on lateral portions of her breasts that have been there for a few months, Family history of breast cancer. Had a mammogram in past for breast lumps but cannot recall results.  Patient denies abdominal pain, vaginal lesions or rashes, sore throat, vaginal bleeding, pain with sex.  Review of Systems  Constitutional:  Negative for weight loss.  Eyes:  Negative for blurred vision.  Respiratory:  Negative for cough and shortness of breath.   Cardiovascular:  Negative for claudication.  Gastrointestinal:  Negative for nausea.  Genitourinary:  Negative for dysuria and frequency.       Vaginal discharge, irritation, itching  Skin:  Negative for rash.  Neurological:  Negative for headaches.  Endo/Heme/Allergies:  Does not bruise/bleed easily.     See flowsheet for further details and programmatic requirements Hyperlink available at the top of the signed note in blue.  Flow sheet content below:  Pregnancy Intention Screening Does the patient want to become pregnant in the next year?: No Does the patient's partner want to become pregnant in the next year?: No Would the patient like to discuss contraceptive options today?: No Results Follow up Password: [email or web address] Contraception History Past methods of contraception used by patient:: Intrauterine device or system (IDU,IUS) Adverse effects associated with Intrauterine device or system (IUD,IUS): none Sexual History What age did you start your period?: 14 How often do you have your period?: monthly Date of last sex?: 11/14/23 Has the patient had unprotected sex within the last 5 days?: No Do you have sex with men, women, both men and women?: Men only In the past 2 months how many partners have you had sex with?: 1 In the past 12 months, how many partners have you had sex with?: 1 Is it possible that any of your sex partners in the past 12 months had sex with someone else whild they were still in a sexual relationship with you?: Yes What ways do you have sex?: Vaginal Do you or your partner use condoms and/or dental dams every time you have vaginal, oral or anal sex?: No Do you douche?: No Date of last HIV test?: 07/11/23 Have you ever had an STD?: Yes Have any of your partners had an STD?: Yes Partner Previous STD?: Trichomonas Date?:  (2019) Have you or your partner ever shot up drugs?: No Have any of  your partners used drugs in the past?: No Have you or your partners exchanged money or drugs for sex?: No Risk Factors for Hep B Household, sexual, or needle sharing contact of a person infected with Hep B: No Sexual contact with a person who uses drugs not as prescribed?: No Currently or Ever used drugs not as prescribed: No HIV Positive: No PRep Patient: No Men who have sex  with men: No Have Hepatitis C: No History of Incarceration: No History of Homeslessness?: No Anal sex following anal drug use?: No Risk Factors for Hep C Currently using drugs not as prescribed: No Sexual partner(s) currently using drugs as not prescribed: No History of drug use: No HIV Positive: No People with a history of incarceration: No People born between the years of 80 and 70: No Contraception Wrap Up Current Method: No Contraceptive Precautions End Method: No Contraception Precautions (declines) Contraception Counseling Provided: Yes  Diabetes screening This patient is 38 y.o. with a BMI of Body mass index is 22.04 kg/m.Catherine Wolfe  Is patient eligible for diabetes screening (age >35 and BMI >25)?  no  Was Hgb A1c ordered? not applicable  STI screening Patient reports 1 partner in last year.  Does this patient desire STI screening?  Yes  Hepatitis C screening Has patient been screened once for HCV in the past?  Yes  No results found for: HCVAB  Does the patient meet criteria for HCV testing? No  (If yes-- Screen for HCV through [facility]) Criteria:  Since the last HCV result, does the patient have any of the following? - Current drug use - Have a partner with drug use - Has been incarcerated  Hepatitis B screening Does the patient meet criteria for HBV testing? No Criteria:  -Household, sexual or needle sharing contact with HBV -History of drug use -HIV positive -Those with known Hep C  Cervical Cancer Screening  Result Date Procedure Results Follow-ups  04/22/2022 Surgical pathology SURGICAL PATHOLOGY: SURGICAL PATHOLOGY CASE: [id number] PATIENT: Catherine Wolfe Surgical Pathology Report     Clinical History: ASCUS, + HR HPV (cm)     FINAL MICROSCOPIC DIAGNOSIS:  A. CERVIX, 11:00, BIOPSY: Minute fragment of squamous mucosa with denuded ...   02/18/2022 IGP, Aptima HPV DIAGNOSIS:: Comment (A) Recommendation:: Comment (A) Specimen adequacy::  Comment Clinician Provided ICD10: Comment Performed by:: Comment Electronically signed by:: Comment PAP Smear Comment: . PATHOLOGIST PROVIDED ICD10:: Comment Note:: Comment Test Methodology: Comment HPV Aptima: Positive (A)   09/04/2020 HM PAP SMEAR HM Pap smear: NIL, positive HPV   02/23/2019 HM PAP SMEAR HM Pap smear: NIL   09/01/2017 HM PAP SMEAR HM Pap smear: Normal, HPV positive     Health Maintenance Due  Topic Date Due   DTaP/Tdap/Td (2 - Td or Tdap) 07/09/2020   COVID-19 Vaccine (3 - 2024-25 season) 02/09/2023   Cervical Cancer Screening (Pap smear)  02/19/2023    The following portions of the patient's history were reviewed and updated as appropriate: allergies, current medications, past family history, past medical history, past social history, past surgical history and problem list. Problem list updated.  Objective:   Vitals:   12/03/23 1311  BP: 110/66  Pulse: 86  Weight: 128 lb 6.4 oz (58.2 kg)  Height: 5' 4 (1.626 m)    Physical Exam Vitals and nursing note reviewed. Exam conducted with a chaperone present Catherine Wolfe).  Constitutional:      Appearance: Normal appearance.  HENT:     Head: Normocephalic and atraumatic.  Mouth/Throat:     Mouth: Mucous membranes are moist.     Pharynx: Oropharynx is clear. No oropharyngeal exudate or posterior oropharyngeal erythema.  Pulmonary:     Effort: Pulmonary effort is normal.  Chest:     Chest wall: Mass present.      Comments: Firm but mobile nodules located at red circles Abdominal:     General: Abdomen is flat.     Palpations: There is no mass.     Tenderness: There is no abdominal tenderness. There is no rebound.  Genitourinary:    General: Normal vulva.     Exam position: Lithotomy position.     Pubic Area: No rash or pubic lice.      Labia:        Right: No rash or lesion.        Left: No rash or lesion.      Vagina: Vaginal discharge present. No erythema, bleeding or lesions.     Cervix:  No cervical motion tenderness, discharge, friability, lesion or erythema.     Uterus: Normal.      Adnexa: Right adnexa normal and left adnexa normal.     Comments: pH = <4.5 Lymphadenopathy:     Head:     Right side of head: No preauricular or posterior auricular adenopathy.     Left side of head: No preauricular or posterior auricular adenopathy.     Cervical: No cervical adenopathy.     Upper Body:     Right upper body: No supraclavicular, axillary or epitrochlear adenopathy.     Left upper body: No supraclavicular, axillary or epitrochlear adenopathy.     Lower Body: No right inguinal adenopathy. No left inguinal adenopathy.   Skin:    General: Skin is warm and dry.     Findings: No rash.   Neurological:     Mental Status: She is alert and oriented to person, place, and time.     Assessment and Plan:  Faryal Marxen is a 38 y.o. female G2P2002 presenting to the [facility] Department for an yearly wellness and contraception visit  Contraception counseling:  Reviewed options based on patient desire and reproductive life plan. Patient is interested in No Method - No Contraceptive Precautions. She does not want to initiate contraception at this time.  Risks, benefits, and typical effectiveness rates were reviewed.  Questions were answered.  Written information was also given to the patient to review.    The patient will follow up in 1 year for surveillance, as needed for contraception/STI screening.  The patient was told to call with any further questions, or with any concerns about this method of contraception.  Emphasized use of condoms 100% of the time for STI prevention.  Emergency Contraception Precautions (ECP): Patient assessed for need of ECP. She is not a candidate based on no intercourse in >2 weeks. Educated on ECP and reviewed options.  Patient desires no method.  1. Family planning (Primary) - Does not desire contraceptive counseling today  2. Abnormal  cervical Papanicolaou smear, unspecified abnormal pap finding - History of ASCUS w/ positive HR HPV, negative colposcopy then subsequently lost to follow up - IGP, Aptima HPV today  3. Screening for venereal disease  - HIV [place] [facility] - Chlamydia/Gonorrhea [place] [facility] - Syphilis Serology, [place] [facility] - WET PREP FOR TRICH, YEAST, CLUE  4. Bacterial vaginosis -although does not meet criteria for BV- due to pt report of bothersome discharge, itching, irritation, will treat today  -  metroNIDAZOLE  (FLAGYL ) 500 MG tablet; Take 1 tablet (500 mg total) by mouth 2 (two) times daily for 7 days.  5. Well woman exam with routine gynecological exam   6. Breast nodule - Referral sent to [facility] -fam hx of breast cancer, breast nodules noted on exam that she thinks have been there several months, previously seen at [facility] and had mammogram but pt unsure of results.   No follow-ups on file.  Future Appointments  Date Time Provider Department Center  01/05/2024 10:35 AM Starla Harland BROCKS, MD [facility] None    Verneta Bers, [place]

## 2023-12-03 NOTE — Progress Notes (Signed)
 Pt is here for annual visit and pap. Wet prep reviewed with patient. The patient was dispensed Metronidazole  500 mg 2x/day for 7 days. I provided counseling today regarding the medication, the side effects and when to call clinic. Condoms declined. Patient was given the opportunity to ask questions for any clarification. Wilkie Drought, RN.

## 2023-12-06 LAB — IGP, APTIMA HPV
HPV Aptima: NEGATIVE
PAP Smear Comment: 0

## 2023-12-08 ENCOUNTER — Ambulatory Visit: Payer: Self-pay | Admitting: Family Medicine

## 2023-12-17 ENCOUNTER — Encounter: Payer: Self-pay | Admitting: Family Medicine

## 2024-01-05 ENCOUNTER — Ambulatory Visit: Admitting: Obstetrics & Gynecology

## 2024-05-04 ENCOUNTER — Ambulatory Visit
Admission: EM | Admit: 2024-05-04 | Discharge: 2024-05-04 | Disposition: A | Discharge status: Home / Self Care | Attending: Emergency Medicine | Admitting: Emergency Medicine

## 2024-05-04 ENCOUNTER — Encounter: Payer: Self-pay | Admitting: Emergency Medicine

## 2024-05-04 DIAGNOSIS — J029 Acute pharyngitis, unspecified: Secondary | ICD-10-CM | POA: Diagnosis present

## 2024-05-04 LAB — POCT RAPID STREP A (OFFICE): Rapid Strep A Screen: NEGATIVE

## 2024-05-04 LAB — POC COVID19/FLU A&B COMBO
Covid Antigen, POC: NEGATIVE
Influenza A Antigen, POC: NEGATIVE
Influenza B Antigen, POC: NEGATIVE

## 2024-05-04 NOTE — ED Triage Notes (Signed)
Pt reports sore throat x 1 day. 

## 2024-05-04 NOTE — Discharge Instructions (Addendum)
 Your testing today was negative for strep, COVID, and influenza.  We have will send your throat swab for culture.  Gargle with warm salt water 2-3 times a day to soothe your throat, aid in pain relief, and aid in healing.  Take over-the-counter Tylenol  and/or ibuprofen  according to the package instructions as needed for pain.  You can also use Chloraseptic or Sucrets lozenges, 1 lozenge every 2 hours as needed for throat pain.  If you develop any new or worsening symptoms return for reevaluation.

## 2024-05-04 NOTE — ED Provider Notes (Addendum)
 [facility]    CSN: [id number] Arrival date & time: 05/04/24  1356      History   Chief Complaint No chief complaint on file.   HPI Catherine Wolfe is a 38 y.o. female.   HPI  38 year old female with no significant past medical history presents for evaluation of 1 day worth of sore throat.  No associate fever, runny nose, nasal congestion, or cough.  One of her friends recently complained of a sore throat.  Past Medical History:  Diagnosis Date   Vaginal Pap smear, abnormal 08/2017   at [facility]    Patient Active Problem List   Diagnosis Date Noted   Abnormal Pap smear of cervix 07/11/2023   Depression/anxiety 2023 07/11/2023   Herpes, genital 07/03/2009    Past Surgical History:  Procedure Laterality Date   NO PAST SURGERIES     TUMOR REMOVAL  09/2023    OB History     Gravida  2   Para  2   Term  2   Preterm      AB      Living  2      SAB      IAB      Ectopic      Multiple      Live Births  2            Home Medications    Prior to Admission medications   Medication Sig Start Date End Date Taking? Authorizing Provider  WELLBUTRIN SR 150 MG 12 hr tablet Take 150 mg by mouth daily. 03/15/24  Yes [provider]  ferrous sulfate 325 (65 FE) MG tablet Take 325 mg by mouth every other day. Patient not taking: Reported on 03/20/2023 03/23/22   [provider]  fluticasone (FLONASE) 50 MCG/ACT nasal spray Place 2 sprays into both nostrils daily. Patient not taking: Reported on 03/20/2023 09/18/22   [provider]    Family History Family History  Problem Relation Age of Onset   Healthy Mother    Alcohol abuse Father    Healthy Sister    Healthy Sister    Healthy Daughter    Healthy Son    Diabetes Maternal Aunt    Breast cancer Paternal Aunt    Breast cancer Maternal Grandmother    Lung cancer Paternal Grandmother    Diabetes Cousin    Diabetes Cousin     Social  History Social History   Tobacco Use   Smoking status: Former    Types: Cigarettes    Passive exposure: Never   Smokeless tobacco: Never  Vaping Use   Vaping status: Never Used  Substance Use Topics   Alcohol use: Not Currently    Alcohol/week: 2.0 standard drinks of alcohol    Types: 2 Standard drinks or equivalent per week    Comment: last use 03/14/23   Drug use: Not Currently    Types: Marijuana    Comment: last use 2018     Allergies   Penicillins   Review of Systems Review of Systems  Constitutional:  Negative for fever.  HENT:  Positive for sore throat. Negative for congestion, ear pain and rhinorrhea.   Respiratory:  Negative for cough.      Physical Exam Triage Vital Signs ED Triage Vitals  Encounter Vitals Group     BP      Girls Systolic BP Percentile      Girls Diastolic BP Percentile      Boys  Systolic BP Percentile      Boys Diastolic BP Percentile      Pulse      Resp      Temp      Temp src      SpO2      Weight      Height      Head Circumference      Peak Flow      Pain Score      Pain Loc      Pain Education      Exclude from Growth Chart    No data found.  Updated Vital Signs BP 103/65 (BP Location: Right Arm)   Pulse 92   Temp 98.6 F (37 C) (Oral)   Resp 18   LMP 04/10/2024 (Exact Date)   SpO2 97%   Visual Acuity Right Eye Distance:   Left Eye Distance:   Bilateral Distance:    Right Eye Near:   Left Eye Near:    Bilateral Near:     Physical Exam Vitals and nursing note reviewed.  Constitutional:      Appearance: Normal appearance. She is not ill-appearing.  HENT:     Head: Normocephalic and atraumatic.     Mouth/Throat:     Mouth: Mucous membranes are moist.     Pharynx: Oropharynx is clear. Posterior oropharyngeal erythema present. No oropharyngeal exudate.     Comments: Tonsillar pillars are unremarkable though the lateral aspects of her soft palate are erythematous bilaterally. Musculoskeletal:      Cervical back: Normal range of motion and neck supple. No tenderness.  Lymphadenopathy:     Cervical: No cervical adenopathy.  Skin:    General: Skin is warm and dry.     Capillary Refill: Capillary refill takes less than 2 seconds.     Findings: No rash.  Neurological:     General: No focal deficit present.     Mental Status: She is alert and oriented to person, place, and time.      UC Treatments / Results  Labs (all labs ordered are listed, but only abnormal results are displayed) Labs Reviewed  POCT RAPID STREP A (OFFICE) - Normal  POC COVID19/FLU A&B COMBO - Normal  CULTURE, GROUP A STREP [facility])    EKG   Radiology No results found.  Procedures Procedures (including critical care time)  Medications Ordered in UC Medications - No data to display  Initial Impression / Assessment and Plan / UC Course  I have reviewed the triage vital signs and the nursing notes.  Pertinent labs & imaging results that were available during my care of the patient were reviewed by me and considered in my medical decision making (see chart for details).   Patient is a pleasant, nontoxic-appearing 38 year old female presenting for evaluation of sore throat and isolation to any other associated upper or lower respiratory symptoms and no fever.  She does have erythema to the lateral aspects of her soft palate bilaterally but no tonsillar hypertrophy or exudate noted.  No cervical lymphadenopathy present.  Suspect this is most likely viral but I will obtain a rapid strep.  Rapid strep is negative.  I will send swab for culture.  A respiratory antigen test was collected at triage which is negative for influenza or COVID.  I will discharge patient on the diagnosis of pharyngitis.  I will have her use over-the-counter Tylenol  and/or ibuprofen  as needed for the pain along with salt water gargles and Chloraseptic or Sucrets lozenges.  Return precautions reviewed.   Final Clinical Impressions(s) /  UC Diagnoses   Final diagnoses:  Acute pharyngitis, unspecified etiology     Discharge Instructions      Your testing today was negative for strep, COVID, and influenza.  We have will send your throat swab for culture.  Gargle with warm salt water 2-3 times a day to soothe your throat, aid in pain relief, and aid in healing.  Take over-the-counter Tylenol  and/or ibuprofen  according to the package instructions as needed for pain.  You can also use Chloraseptic or Sucrets lozenges, 1 lozenge every 2 hours as needed for throat pain.  If you develop any new or worsening symptoms return for reevaluation.      ED Prescriptions   None    PDMP not reviewed this encounter.   Bernardino Ditch, NP 05/04/24 1425    Bernardino Ditch, NP 05/07/24 [phone]

## 2024-05-07 ENCOUNTER — Telehealth: Payer: Self-pay | Admitting: Emergency Medicine

## 2024-05-07 ENCOUNTER — Ambulatory Visit: Payer: Self-pay | Admitting: Emergency Medicine

## 2024-05-07 LAB — CULTURE, GROUP A STREP (THRC)

## 2024-05-07 MED ORDER — AZITHROMYCIN 250 MG PO TABS: 250.0000 mg | ORAL_TABLET | Freq: Every day | ORAL | 0 refills | Status: AC

## 2024-05-07 NOTE — Telephone Encounter (Signed)
 Blood culture came back positive for strep.  Patient has not due to penicillin.  Azithromycin sent to the pharmacy.

## 2024-07-19 ENCOUNTER — Ambulatory Visit: Admitting: Obstetrics & Gynecology
